# Patient Record
Sex: Female | Born: 1970 | Race: Black or African American | Hispanic: No | Marital: Married | Smoking: Former smoker
Health system: Southern US, Community
[De-identification: ages and names within clinical notes are randomized; demographics above are authoritative.]

## PROBLEM LIST (undated history)

## (undated) DIAGNOSIS — A749 Chlamydial infection, unspecified: Secondary | ICD-10-CM

## (undated) DIAGNOSIS — G4730 Sleep apnea, unspecified: Secondary | ICD-10-CM

## (undated) DIAGNOSIS — K219 Gastro-esophageal reflux disease without esophagitis: Secondary | ICD-10-CM

## (undated) DIAGNOSIS — Z803 Family history of malignant neoplasm of breast: Secondary | ICD-10-CM

## (undated) DIAGNOSIS — I10 Essential (primary) hypertension: Secondary | ICD-10-CM

## (undated) DIAGNOSIS — R7303 Prediabetes: Secondary | ICD-10-CM

## (undated) DIAGNOSIS — B379 Candidiasis, unspecified: Secondary | ICD-10-CM

## (undated) DIAGNOSIS — R0602 Shortness of breath: Secondary | ICD-10-CM

## (undated) HISTORY — DX: Shortness of breath: R06.02

## (undated) HISTORY — DX: Essential (primary) hypertension: I10

## (undated) HISTORY — PX: HEMORRHOID SURGERY: SHX153

## (undated) HISTORY — DX: Sleep apnea, unspecified: G47.30

## (undated) HISTORY — PX: BUNIONECTOMY: SHX129

## (undated) HISTORY — DX: Gastro-esophageal reflux disease without esophagitis: K21.9

## (undated) HISTORY — DX: Family history of malignant neoplasm of breast: Z80.3

## (undated) HISTORY — PX: ABDOMINAL HYSTERECTOMY: SHX81

## (undated) HISTORY — PX: WISDOM TOOTH EXTRACTION: SHX21

## (undated) HISTORY — DX: Prediabetes: R73.03

## (undated) HISTORY — DX: Chlamydial infection, unspecified: A74.9

## (undated) HISTORY — DX: Candidiasis, unspecified: B37.9

---

## 2006-06-05 ENCOUNTER — Emergency Department (HOSPITAL_COMMUNITY): Admission: EM | Admit: 2006-06-05 | Discharge: 2006-06-05 | Payer: Self-pay | Admitting: Family Medicine

## 2009-05-18 ENCOUNTER — Emergency Department (HOSPITAL_COMMUNITY): Admission: EM | Admit: 2009-05-18 | Discharge: 2009-05-18 | Payer: Self-pay | Admitting: Family Medicine

## 2012-04-21 ENCOUNTER — Encounter: Payer: Self-pay | Admitting: Obstetrics and Gynecology

## 2012-04-21 ENCOUNTER — Ambulatory Visit (INDEPENDENT_AMBULATORY_CARE_PROVIDER_SITE_OTHER): Payer: 59 | Admitting: Obstetrics and Gynecology

## 2012-04-21 VITALS — BP 134/90 | HR 72 | Ht 64.0 in | Wt 207.0 lb

## 2012-04-21 DIAGNOSIS — N898 Other specified noninflammatory disorders of vagina: Secondary | ICD-10-CM

## 2012-04-21 DIAGNOSIS — Z1231 Encounter for screening mammogram for malignant neoplasm of breast: Secondary | ICD-10-CM

## 2012-04-21 DIAGNOSIS — Z113 Encounter for screening for infections with a predominantly sexual mode of transmission: Secondary | ICD-10-CM

## 2012-04-21 DIAGNOSIS — Z124 Encounter for screening for malignant neoplasm of cervix: Secondary | ICD-10-CM

## 2012-04-21 DIAGNOSIS — A499 Bacterial infection, unspecified: Secondary | ICD-10-CM

## 2012-04-21 DIAGNOSIS — N920 Excessive and frequent menstruation with regular cycle: Secondary | ICD-10-CM

## 2012-04-21 DIAGNOSIS — N760 Acute vaginitis: Secondary | ICD-10-CM

## 2012-04-21 LAB — POCT WET PREP (WET MOUNT)
Clue Cells Wet Prep Whiff POC: POSITIVE
pH: 5

## 2012-04-21 LAB — CBC
Hemoglobin: 12.8 g/dL (ref 12.0–15.0)
MCH: 28.1 pg (ref 26.0–34.0)
MCV: 85.1 fL (ref 78.0–100.0)
RBC: 4.56 MIL/uL (ref 3.87–5.11)

## 2012-04-21 NOTE — Progress Notes (Signed)
Last Pap: Pt states she has not had 1 in years WNL: Yes Regular Periods:yes Contraception: condoms  Monthly Breast exam:yes Tetanus<27yrs:yes Nl.Bladder Function:yes Daily BMs:yes Healthy Diet:no Calcium:no Mammogram:no Date of Mammogram: n/a Exercise:no Have often Exercise: n/a Seatbelt: yes Abuse at home: no Stressful work:no Sigmoid-colonoscopy: n/a Bone Density: No PCP: Dr. Nicholos Johns Change in PMH: n/a Change in FMH:n/a BP 134/90  Pulse 72  Ht 5\' 4"  (1.626 m)  Wt 207 lb (93.895 kg)  BMI 35.53 kg/m2  LMP 04/11/2012 Pt with complaints:yes she has a smelly vaginaldischarge.  She has some itching  as well.  She also has heavy periods.  She uses a pack of pads with each period and has bleeding through her clothes.  She passes large clots and for two days of her menses she changes a pad every hr.  They last for 3-4 days Physical Examination: General appearance - alert, well appearing, and in no distress Mental status - normal mood, behavior, speech, dress, motor activity, and thought processes Neck - supple, no significant adenopathy,  thyroid exam: thyroid is normal in size without nodules or tenderness Chest - clear to auscultation, no wheezes, rales or rhonchi, symmetric air entry Heart - normal rate and regular rhythm Abdomen - soft, nontender, nondistended, no masses or organomegaly Breasts - breasts appear normal, no suspicious masses, no skin or nipple changes or axillary nodes Pelvic - normal external genitalia, vulva, vagina, cervix, uterus and adnexa Rectal - rectal exam not indicated Back exam - full range of motion, no tenderness, palpable spasm or pain on motion Neurological - alert, oriented, normal speech, no focal findings or movement disorder noted Musculoskeletal - no joint tenderness, deformity or swelling Extremities - no edema, redness or tenderness in the calves or thighs Skin - normal coloration and turgor, no rashes, no suspicious skin lesions  noted Routine exam BV menorrhagia Pap sent yes Mammogram due yes and scheduled condoms used for contraception RT for shg/embx.  Check TSH and PRL with CBC metrogel for BV

## 2012-04-21 NOTE — Patient Instructions (Signed)

## 2012-04-22 ENCOUNTER — Telehealth: Payer: Self-pay | Admitting: Obstetrics and Gynecology

## 2012-04-22 ENCOUNTER — Other Ambulatory Visit: Payer: Self-pay | Admitting: Obstetrics and Gynecology

## 2012-04-22 MED ORDER — METRONIDAZOLE 0.75 % VA GEL: 1.0000 | Freq: Every day | VAGINAL | Status: DC

## 2012-04-22 NOTE — Telephone Encounter (Signed)
TC to pt.  Informed Rx has been sent to pharmacy. Pt verbalizes comprehension.

## 2012-04-22 NOTE — Telephone Encounter (Signed)
VM from pt. Saw DR ND 04/21/12.  Was awaiting Rx for gel to treat BV to be called to [facility].

## 2012-04-25 LAB — PAP IG, CT-NG, RFX HPV ASCU

## 2012-04-28 ENCOUNTER — Encounter: Payer: Self-pay | Admitting: Obstetrics and Gynecology

## 2012-05-14 ENCOUNTER — Ambulatory Visit: Payer: Self-pay

## 2012-06-26 ENCOUNTER — Encounter: Payer: 59 | Admitting: Obstetrics and Gynecology

## 2012-06-27 ENCOUNTER — Encounter: Payer: 59 | Admitting: Obstetrics and Gynecology

## 2014-03-01 ENCOUNTER — Encounter: Payer: Self-pay | Admitting: Obstetrics and Gynecology

## 2014-07-14 ENCOUNTER — Encounter (HOSPITAL_COMMUNITY): Payer: Self-pay | Admitting: Emergency Medicine

## 2014-07-14 ENCOUNTER — Emergency Department (INDEPENDENT_AMBULATORY_CARE_PROVIDER_SITE_OTHER): Payer: 59

## 2014-07-14 ENCOUNTER — Emergency Department (HOSPITAL_COMMUNITY)
Admission: EM | Admit: 2014-07-14 | Discharge: 2014-07-14 | Disposition: A | Discharge status: Home / Self Care | Payer: 59 | Source: Home / Self Care | Attending: Family Medicine | Admitting: Family Medicine

## 2014-07-14 DIAGNOSIS — M25562 Pain in left knee: Secondary | ICD-10-CM

## 2014-07-14 DIAGNOSIS — M25572 Pain in left ankle and joints of left foot: Secondary | ICD-10-CM | POA: Diagnosis not present

## 2014-07-14 LAB — CBC
HEMATOCRIT: 36.9 % (ref 36.0–46.0)
HEMOGLOBIN: 11.6 g/dL — AB (ref 12.0–15.0)
MCH: 24.7 pg — ABNORMAL LOW (ref 26.0–34.0)
MCHC: 31.4 g/dL (ref 30.0–36.0)
MCV: 78.5 fL (ref 78.0–100.0)
Platelets: 358 10*3/uL (ref 150–400)
RBC: 4.7 MIL/uL (ref 3.87–5.11)
RDW: 18 % — AB (ref 11.5–15.5)
WBC: 7.5 10*3/uL (ref 4.0–10.5)

## 2014-07-14 LAB — POCT I-STAT, CHEM 8
BUN: 10 mg/dL (ref 6–23)
Calcium, Ion: 1.1 mmol/L — ABNORMAL LOW (ref 1.12–1.23)
Chloride: 102 mmol/L (ref 96–112)
Creatinine, Ser: 0.8 mg/dL (ref 0.50–1.10)
GLUCOSE: 96 mg/dL (ref 70–99)
HEMATOCRIT: 41 % (ref 36.0–46.0)
HEMOGLOBIN: 13.9 g/dL (ref 12.0–15.0)
POTASSIUM: 4.4 mmol/L (ref 3.5–5.1)
Sodium: 137 mmol/L (ref 135–145)
TCO2: 23 mmol/L (ref 0–100)

## 2014-07-14 LAB — D-DIMER, QUANTITATIVE: D-Dimer, Quant: 0.39 ug/mL-FEU (ref 0.00–0.48)

## 2014-07-14 MED ORDER — DICLOFENAC SODIUM 75 MG PO TBEC: 75.0000 mg | DELAYED_RELEASE_TABLET | Freq: Two times a day (BID) | ORAL | Status: DC | PRN

## 2014-07-14 MED ORDER — CLINDAMYCIN HCL 300 MG PO CAPS: 300.0000 mg | ORAL_CAPSULE | Freq: Three times a day (TID) | ORAL | Status: DC
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
## 2014-07-14 NOTE — Discharge Instructions (Signed)
Thank you for coming in today. Use the cam walker as needed. I will call with results of lab tests today We will call and other medicines as needed Call or go to the emergency room if you get worse, have trouble breathing, have chest pains, or palpitations.   Posterior Tibial Tendon Tendinitis with Rehab Tendonitis is a condition that is characterized by inflammation of a tendon or the lining (sheath) that surrounds it. The inflammation is usually caused by damage to the tendon, such as a tendon tear (strain). Sprains are classified into three categories. Grade 1 sprains cause pain, but the tendon is not lengthened. Grade 2 sprains include a lengthened ligament due to the ligament being stretched or partially ruptured. With grade 2 sprains there is still function, although the function may be diminished. Grade 3 sprains are characterized by a complete tear of the tendon or muscle, and function is usually impaired. Posterior tibialis tendonitis is tendonitis of the posterior tibial tendon, which attaches muscles of the lower leg to the foot. The posterior tibial tendon is located in the back of the ankle and helps the body straighten (plantar flex) and rotate inward (medially rotate) the ankle. SYMPTOMS   Pain, tenderness, swelling, warmth, and/or redness over the back of the inner ankle at the posterior tibial tendon or the inner part of the mid-foot.  Pain that worsens with plantar flexion or medial rotation of the ankle.  A crackling sound (crepitation) when the tendon is moved or touched. CAUSES  Posterior tibial tendonitis occurs when damage to the posterior tibial tendon starts an inflammatory response. Common mechanisms of injury include:  Degenerative (occurs with aging) processes that weaken the tendon and make it more susceptible to injury.  Stress placed on the tendon from an increase in the intensity, frequency, or duration of training.  Direct trauma to the ankle.  Returning to  activity before a previous ankle injury is allowed to heal. RISK INCREASES WITH:  Activities that involve repetitive and/or stressful plantar flexion (jumping, kicking, or running up/down hills).  Poor strength and flexibility.  Flat feet.  Previous injury to the foot, ankle, or leg. PREVENTION   Warm up and stretch properly before activity.  Allow for adequate recovery between workouts.  Maintain physical fitness:  Strength, flexibility, and endurance.  Cardiovascular fitness.  Learn and use proper technique. When possible, have a coach correct improper technique.  Complete rehabilitation from a previous foot, ankle, or leg injury.  If you have flat feet, wear arch supports (orthotics). PROGNOSIS  If treated properly, the symptoms of tendonitis usually resolve within 6 weeks. This period may be shorter for injuries caused by direct trauma. RELATED COMPLICATIONS   Prolonged healing time, if improperly treated or reinjured.  Recurrent symptoms that result in a chronic problem.  Partial or complete tendon tear (rupture) requiring surgery. TREATMENT  Treatment initially involves the use of ice and medication to help reduce pain and inflammation. The use of strengthening and stretching exercises may help reduce pain with activity. These exercises may be performed at home or with referral to a therapist. Often times, your caregiver will recommend immobilizing the ankle to allow the tendon to heal. If you have flat feet, you may be advised to wear orthotic arch supports. If symptoms persist for greater than 6 months despite nonsurgical (conservative) treatment, then surgery may be recommended. MEDICATION   If pain medication is necessary, then nonsteroidal anti-inflammatory medications, such as aspirin and ibuprofen, or other minor pain relievers, such as acetaminophen,  are often recommended.  Do not take pain medication for 7 days before surgery.  Prescription pain relievers  may be given if deemed necessary by your caregiver. Use only as directed and only as much as you need.  Corticosteroid injections may be given by your caregiver. These injections should be reserved for the most serious cases because they may only be given a certain number of times. HEAT AND COLD  Cold treatment (icing) relieves pain and reduces inflammation. Cold treatment should be applied for 10 to 15 minutes every 2 to 3 hours for inflammation and pain and immediately after any activity that aggravates your symptoms. Use ice packs or massage the area with a piece of ice (ice massage).  Heat treatment may be used prior to performing the stretching and strengthening activities prescribed by your caregiver, physical therapist, or athletic trainer. Use a heat pack or soak the injury in warm water. SEEK MEDICAL CARE IF:  Treatment seems to offer no benefit, or the condition worsens.  Any medications produce adverse side effects. EXERCISES RANGE OF MOTION (ROM) AND STRETCHING EXERCISES - Posterior Tibial Tendon Tendinitis These exercises may help you when beginning to rehabilitate your injury. Your symptoms may resolve with or without further involvement from your physician, physical therapist or athletic trainer. While completing these exercises, remember:   Restoring tissue flexibility helps normal motion to return to the joints. This allows healthier, less painful movement and activity.  An effective stretch should be held for at least 30 seconds.  A stretch should never be painful. You should only feel a gentle lengthening or release in the stretched tissue. RANGE OF MOTION - Ankle Plantar Flexion   Sit with your right / left leg crossed over your opposite knee.  Use your opposite hand to pull the top of your foot and toes toward you.  You should feel a gentle stretch on the top of your foot/ankle. Hold this position for __________ seconds. Repeat __________ times. Complete this  exercise __________ times per day.  RANGE OF MOTION - Ankle Eversion   Sit with your right / left ankle crossed over your opposite knee.  Grip your foot with your opposite hand, placing your thumb on the top of your foot and your fingers across the bottom of your foot.  Gently push your foot downward with a slight rotation so your littlest toes rise slightly.  You should feel a gentle stretch on the inside of your ankle. Hold the stretch for __________ seconds. Repeat __________ times. Complete this exercise __________ times per day.  RANGE OF MOTION - Ankle Inversion   Sit with your right / left ankle crossed over your opposite knee.  Grip your foot with your opposite hand, placing your thumb on the bottom of your foot and your fingers across the top of your foot.  Gently pull your foot so the smallest toe comes toward you and your thumb pushes the inside of the ball of your foot away from you.  You should feel a gentle stretch on the outside of your ankle. Hold the stretch for __________ seconds. Repeat __________ times. Complete this exercise __________ times per day.  RANGE OF MOTION - Dorsi/Plantar Flexion  While sitting with your right / left knee straight, draw the top of your foot upward by flexing your ankle. Then reverse the motion, pointing your toes downward.  Hold each position for __________ seconds.  After completing your first set of exercises, repeat this exercise with your knee bent. Repeat __________  times. Complete this exercise __________ times per day.  RANGE OF MOTION - Ankle Alphabet  Imagine your right / left big toe is a pen.  Keeping your hip and knee still, write out the entire alphabet with your "pen." Make the letters as large as you can without increasing any discomfort. Repeat __________ times. Complete this exercise __________ times per day.  STRETCH - Gastrocsoleus   Sit with your right / left leg extended. Holding onto both ends of a belt or  towel, loop it around the ball of your foot.  Keeping your right / left ankle and foot relaxed and your knee straight, pull your foot and ankle toward you using the belt/towel.  You should feel a gentle stretch behind your calf or knee. Hold this position for __________ seconds. Repeat __________ times. Complete this exercise __________ times per day.  STRETCH - Gastroc, Standing   Place hands on wall.  Extend right / left leg, keeping the front knee somewhat bent.  Slightly point your toes inward on your back foot.  Keeping your right / left heel on the floor and your knee straight, shift your weight toward the wall, not allowing your back to arch.  You should feel a gentle stretch in the right / left calf. Hold this position for __________ seconds. Repeat __________ times. Complete this stretch __________ times per day. STRETCH - Soleus, Standing   Place hands on wall.  Extend right / left leg, keeping the other knee somewhat bent.  Slightly point your toes inward on your back foot.  Keep your right / left heel on the floor, bend your back knee, and slightly shift your weight over the back leg so that you feel a gentle stretch deep in your back calf.  Hold this position for __________ seconds. Repeat __________ times. Complete this stretch __________ times per day. STRENGTHENING EXERCISES - Posterior Tibial Tendon Tendinitis These exercises may help you when beginning to rehabilitate your injury. They may resolve your symptoms with or without further involvement from your physician, physical therapist, or athletic trainer. While completing these exercises, remember:   Muscles can gain both the endurance and the strength needed for everyday activities through controlled exercises.  Complete these exercises as instructed by your physician, physical therapist, or athletic trainer. Progress the resistance and repetitions only as guided. STRENGTH - Dorsiflexors  Secure a rubber  exercise band/tubing to a fixed object (i.e., table, pole) and loop the other end around your right / left foot.  Sit on the floor facing the fixed object. The band/tubing should be slightly tense when your foot is relaxed.  Slowly draw your foot back toward you using your ankle and toes.  Hold this position for __________ seconds. Slowly release the tension in the band and return your foot to the starting position. Repeat __________ times. Complete this exercise __________ times per day.  STRENGTH - Towel Curls  Sit in a chair positioned on a non-carpeted surface.  Place your foot on a towel, keeping your heel on the floor.  Pull the towel toward your heel by only curling your toes. Keep your heel on the floor.  If instructed by your physician, physical therapist, or athletic trainer, add ____________________ at the end of the towel. Repeat __________ times. Complete this exercise __________ times per day. STRENGTH - Ankle Eversion   Secure one end of a rubber exercise band/tubing to a fixed object (table, pole). Loop the other end around your foot just before your toes.  Place your fists between your knees. This will focus your strengthening at your ankle.  Drawing the band/tubing across your opposite foot, slowly pull your little toe out and up. Make sure the band/tubing is positioned to resist the entire motion.  Hold this position for __________ seconds.  Have your muscles resist the band/tubing as it slowly pulls your foot back to the starting position. Repeat __________ times. Complete this exercise __________ times per day.  STRENGTH - Ankle Inversion   Secure one end of a rubber exercise band/tubing to a fixed object (table, pole). Loop the other end around your foot just before your toes.  Place your fists between your knees. This will focus your strengthening at your ankle.  Slowly, pull your big toe up and in, making sure the band/tubing is positioned to resist the  entire motion.  Hold this position for __________ seconds.  Have your muscles resist the band/tubing as it slowly pulls your foot back to the starting position. Repeat __________ times. Complete this exercises __________ times per day.  Document Released: 04/16/2005 Document Revised: 08/31/2013 Document Reviewed: 07/29/2008 [facility] Patient Information 2015 [place], [place]. This information is not intended to replace advice given to you by your health care provider. Make sure you discuss any questions you have with your health care provider.   Cellulitis Cellulitis is an infection of the skin and the tissue beneath it. The infected area is usually red and tender. Cellulitis occurs most often in the arms and lower legs.  CAUSES  Cellulitis is caused by bacteria that enter the skin through cracks or cuts in the skin. The most common types of bacteria that cause cellulitis are staphylococci and streptococci. SIGNS AND SYMPTOMS   Redness and warmth.  Swelling.  Tenderness or pain.  Fever. DIAGNOSIS  Your health care provider can usually determine what is wrong based on a physical exam. Blood tests may also be done. TREATMENT  Treatment usually involves taking an antibiotic medicine. HOME CARE INSTRUCTIONS   Take your antibiotic medicine as directed by your health care provider. Finish the antibiotic even if you start to feel better.  Keep the infected arm or leg elevated to reduce swelling.  Apply a warm cloth to the affected area up to 4 times per day to relieve pain.  Take medicines only as directed by your health care provider.  Keep all follow-up visits as directed by your health care provider. SEEK MEDICAL CARE IF:   You notice red streaks coming from the infected area.  Your red area gets larger or turns dark in color.  Your bone or joint underneath the infected area becomes painful after the skin has healed.  Your infection returns in the same area or another  area.  You notice a swollen bump in the infected area.  You develop new symptoms.  You have a fever. SEEK IMMEDIATE MEDICAL CARE IF:   You feel very sleepy.  You develop vomiting or diarrhea.  You have a general ill feeling (malaise) with muscle aches and pains. MAKE SURE YOU:   Understand these instructions.  Will watch your condition.  Will get help right away if you are not doing well or get worse. Document Released: 01/24/2005 Document Revised: 08/31/2013 Document Reviewed: 07/02/2011 [facility] Patient Information 2015 [place], [place]. This information is not intended to replace advice given to you by your health care provider. Make sure you discuss any questions you have with your health care provider.

## 2014-09-01 ENCOUNTER — Other Ambulatory Visit: Payer: Self-pay | Admitting: Obstetrics and Gynecology

## 2014-09-05 NOTE — Patient Instructions (Addendum)
   Your procedure is scheduled on:  Tuesday, May 17  Enter through the Main Entrance of [facility] at:  7:30 AM Pick up the phone at the desk and dial [phone] and inform us of your arrival.  Please call this number if you have any problems the morning of surgery: [phone]  Remember: Do not eat or drink after midnight: Monday Take these medicines the morning of surgery with a SIP OF WATER: valtrex  Do not wear jewelry, make-up, or FINGER nail polish No metal in your hair or on your body. Do not wear lotions, powders, perfumes.  You may wear deodorant.  Do not bring valuables to the hospital.  Leave suitcase in the car. After Surgery it may be brought to your room. For patients being admitted to the hospital, checkout time is 11:00am the day of discharge.  Home with daughter Shelle Iron cell [phone]

## 2014-09-06 ENCOUNTER — Encounter (HOSPITAL_COMMUNITY)
Admission: RE | Admit: 2014-09-06 | Discharge: 2014-09-06 | Disposition: A | Discharge status: Home / Self Care | Payer: 59 | Source: Ambulatory Visit | Attending: Obstetrics and Gynecology | Admitting: Obstetrics and Gynecology

## 2014-09-06 ENCOUNTER — Encounter (HOSPITAL_COMMUNITY): Payer: Self-pay

## 2014-09-06 ENCOUNTER — Other Ambulatory Visit (HOSPITAL_COMMUNITY): Payer: Self-pay | Admitting: Obstetrics and Gynecology

## 2014-09-06 DIAGNOSIS — Z01818 Encounter for other preprocedural examination: Secondary | ICD-10-CM | POA: Insufficient documentation

## 2014-09-06 LAB — CBC
HEMATOCRIT: 33.1 % — AB (ref 36.0–46.0)
HEMOGLOBIN: 10.2 g/dL — AB (ref 12.0–15.0)
MCH: 23.4 pg — ABNORMAL LOW (ref 26.0–34.0)
MCHC: 30.8 g/dL (ref 30.0–36.0)
MCV: 76.1 fL — ABNORMAL LOW (ref 78.0–100.0)
Platelets: 422 10*3/uL — ABNORMAL HIGH (ref 150–400)
RBC: 4.35 MIL/uL (ref 3.87–5.11)
RDW: 16.9 % — AB (ref 11.5–15.5)
WBC: 7.1 10*3/uL (ref 4.0–10.5)

## 2014-09-06 NOTE — H&P (Signed)
Aimee Mejia is a 44 y.o. female P 0-2-1-2-1 presents for hysterectomy because of irregular bleeding.  Patient gives a history of menorrhagia during her 3-7 day menstrual flow that over the past 6 months has begun to happen twice a month.  Each bleeding episode requires the patient to wear a Depends  to prevent soiling of clothes and linen.  She admits to clots and cramping rated at 8/10 on a 10 point pain scale but has found relief with Naproxen.  She denies urinary tract symptoms or vaginitis symptoms but admits to dyspareunia and constipation.  A sono-hysterogram April 2016 showed: uterus: (retroflexed) 7.53 x 8.07 x 6.2 cm, endometrium: 0.638 cm, a posterior intramural fibroid 1.8 x 1.4 x 1.6 cm and no focal lesions seen within the endometrial cavity. Both ovaries appeared normal.  A TSH, Prolactin during this evaluation was normal,  Hemoglobin/Hematocrit = 11.6/36.6  and endometrial biopsy returned benign findings.   A review of both medical and surgical management options were given to the patient however, she has decided on definitive therapy in the form of hysterectomy.   Past Medical History  OB History: G: 4;   P 0-2-2-1;   P: C-section 1998  GYN History: menarche: 44 YO    LMP: 08/24/2014    Contracepton abstinence  The patient reports a past history of: chlamydia and herpes.  Denies history of abnormal PAP smear;   Last PAP smear: 06/22/2014-normal  Medical History: NONE  Surgical History: NONE except C-section Denies problems with anesthesia or history of blood transfusions  Family History: Hypertension, Breast Cancer and Diabetes Mellitus  Social History: Married and employed as a Surveyor, mining;  Former smoker and occasionally uses alcohol   Medications:  Valacyclovir 500 mg bid x 3 days prn Naproxen 550 mg   bid prn  Allergies  Allergen Reactions  . Penicillins Hives  . Percocet [Oxycodone-Acetaminophen] Hives   Able to take Vicodin  Denies sensitivity to  peanuts, shellfish, soy, latex or adhesives.  ROS: Admits to glasses and constipation but  denies headache, vision changes, nasal congestion, dysphagia, tinnitus, dizziness, hoarseness, cough,  chest pain, shortness of breath, nausea, vomiting, diarrhea,  urinary frequency, urgency  dysuria, hematuria, vaginitis symptoms, pelvic pain, swelling of joints,easy bruising,  myalgias, arthralgias, skin rashes, unexplained weight loss and except as is mentioned in the history of present illness, patient's review of systems is otherwise negative.     Physical Exam  Bp:  120/74   P: 80   R: 20   Temperature: 98.4 degrees F orally   Weight: 209 lbs.  Height: 5'4"   BMI: 35.9  Neck: supple without masses or thyromegaly Lungs: clear to auscultation Heart: regular rate and rhythm Abdomen: soft, non-tender and no organomegaly Pelvic:EGBUS- wnl; vagina-normal rugae; uterus-normal size, cervix without lesions or motion tenderness; adnexae-no tenderness or masses Extremities:  no clubbing, cyanosis or edema   Assesment:  Irregular Bleeding            Dysmenorrhea   Disposition:  A discussion was held with patient regarding the indication for her procedure(s) along with the risks, which include but are not limited to: reaction to anesthesia, damage to adjacent organs, infection and excessive bleeding.  The patient verbalized understanding of these risks and has consented to proceed with a Laparoscopically Assisted Total Vaginal Hysterectomy with Bilateral Salpingectomy at [facility] on Sep 14, 2014 at 9 a.m.   CSN# [id number]   Aimee Eugenio J. Florene Glen, PA-C  for Dr.  Franklyn Lor. Dillard

## 2014-09-14 ENCOUNTER — Ambulatory Visit (HOSPITAL_COMMUNITY): Payer: 59 | Admitting: Anesthesiology

## 2014-09-14 ENCOUNTER — Encounter (HOSPITAL_COMMUNITY): Payer: Self-pay | Admitting: Anesthesiology

## 2014-09-14 ENCOUNTER — Encounter (HOSPITAL_COMMUNITY)
Admission: RE | Disposition: A | Discharge status: Home / Self Care | Payer: Self-pay | Source: Ambulatory Visit | Attending: Obstetrics and Gynecology

## 2014-09-14 ENCOUNTER — Observation Stay (HOSPITAL_COMMUNITY)
Admission: RE | Admit: 2014-09-14 | Discharge: 2014-09-15 | Disposition: A | Discharge status: Home / Self Care | Payer: 59 | Source: Ambulatory Visit | Attending: Obstetrics and Gynecology | Admitting: Obstetrics and Gynecology

## 2014-09-14 DIAGNOSIS — D259 Leiomyoma of uterus, unspecified: Principal | ICD-10-CM | POA: Insufficient documentation

## 2014-09-14 DIAGNOSIS — N840 Polyp of corpus uteri: Secondary | ICD-10-CM | POA: Diagnosis not present

## 2014-09-14 DIAGNOSIS — Z87891 Personal history of nicotine dependence: Secondary | ICD-10-CM | POA: Diagnosis not present

## 2014-09-14 DIAGNOSIS — N920 Excessive and frequent menstruation with regular cycle: Secondary | ICD-10-CM | POA: Diagnosis present

## 2014-09-14 DIAGNOSIS — N946 Dysmenorrhea, unspecified: Secondary | ICD-10-CM | POA: Insufficient documentation

## 2014-09-14 DIAGNOSIS — Z6836 Body mass index (BMI) 36.0-36.9, adult: Secondary | ICD-10-CM | POA: Insufficient documentation

## 2014-09-14 HISTORY — PX: LAPAROSCOPIC ASSISTED VAGINAL HYSTERECTOMY: SHX5398

## 2014-09-14 HISTORY — PX: CYSTOSCOPY: SHX5120

## 2014-09-14 LAB — BASIC METABOLIC PANEL
ANION GAP: 8 (ref 5–15)
BUN: 9 mg/dL (ref 6–20)
CHLORIDE: 105 mmol/L (ref 101–111)
CO2: 25 mmol/L (ref 22–32)
Calcium: 8.4 mg/dL — ABNORMAL LOW (ref 8.9–10.3)
Creatinine, Ser: 0.77 mg/dL (ref 0.44–1.00)
GFR calc Af Amer: >60 mL/min (ref >60)
GFR calc non Af Amer: >60 mL/min (ref >60)
Glucose, Bld: 110 mg/dL — ABNORMAL HIGH (ref 65–99)
Potassium: 4.2 mmol/L (ref 3.5–5.1)
Sodium: 138 mmol/L (ref 135–145)

## 2014-09-14 LAB — PREGNANCY, URINE: Preg Test, Ur: NEGATIVE

## 2014-09-14 SURGERY — HYSTERECTOMY, VAGINAL, LAPAROSCOPY-ASSISTED
Anesthesia: General | Site: Bladder

## 2014-09-14 MED ORDER — ESTRADIOL 0.1 MG/GM VA CREA
TOPICAL_CREAM | VAGINAL | Status: DC | PRN
  Administered 2014-09-14: 1 via VAGINAL

## 2014-09-14 MED ORDER — METHYLENE BLUE 1 % INJ SOLN
INTRAMUSCULAR | Status: DC | PRN
  Administered 2014-09-14: 8 mL via INTRAVENOUS

## 2014-09-14 MED ORDER — DEXAMETHASONE SODIUM PHOSPHATE 10 MG/ML IJ SOLN
INTRAMUSCULAR | Status: AC
  Filled 2014-09-14: qty 1

## 2014-09-14 MED ORDER — ROCURONIUM BROMIDE 100 MG/10ML IV SOLN
INTRAVENOUS | Status: AC
  Filled 2014-09-14: qty 1

## 2014-09-14 MED ORDER — MIDAZOLAM HCL 2 MG/2ML IJ SOLN
INTRAMUSCULAR | Status: DC | PRN
  Administered 2014-09-14: 1 mg via INTRAVENOUS

## 2014-09-14 MED ORDER — LACTATED RINGERS IV SOLN
INTRAVENOUS | Status: DC
  Administered 2014-09-14: 22:00:00 via INTRAVENOUS

## 2014-09-14 MED ORDER — VASOPRESSIN 20 UNIT/ML IV SOLN
INTRAVENOUS | Status: DC | PRN
  Administered 2014-09-14: 20 mL via INTRAMUSCULAR

## 2014-09-14 MED ORDER — ONDANSETRON HCL 4 MG/2ML IJ SOLN
INTRAMUSCULAR | Status: AC
  Filled 2014-09-14: qty 2

## 2014-09-14 MED ORDER — BUPIVACAINE HCL (PF) 0.25 % IJ SOLN
INTRAMUSCULAR | Status: AC
  Filled 2014-09-14: qty 30

## 2014-09-14 MED ORDER — ACETAMINOPHEN 160 MG/5ML PO SOLN
975.0000 mg | Freq: Once | ORAL | Status: AC
  Administered 2014-09-14: 975 mg via ORAL

## 2014-09-14 MED ORDER — SCOPOLAMINE 1 MG/3DAYS TD PT72
MEDICATED_PATCH | TRANSDERMAL | Status: AC
  Filled 2014-09-14: qty 1

## 2014-09-14 MED ORDER — LACTATED RINGERS IR SOLN
Status: DC | PRN
  Administered 2014-09-14: 3000 mL

## 2014-09-14 MED ORDER — FENTANYL CITRATE (PF) 250 MCG/5ML IJ SOLN
INTRAMUSCULAR | Status: AC
  Filled 2014-09-14: qty 5

## 2014-09-14 MED ORDER — MIDAZOLAM HCL 2 MG/2ML IJ SOLN
INTRAMUSCULAR | Status: AC
  Filled 2014-09-14: qty 2

## 2014-09-14 MED ORDER — ROCURONIUM BROMIDE 100 MG/10ML IV SOLN
INTRAVENOUS | Status: DC | PRN
  Administered 2014-09-14 (×4): 10 mg via INTRAVENOUS
  Administered 2014-09-14: 50 mg via INTRAVENOUS
  Administered 2014-09-14: 5 mg via INTRAVENOUS

## 2014-09-14 MED ORDER — LIDOCAINE HCL (CARDIAC) 20 MG/ML IV SOLN
INTRAVENOUS | Status: DC | PRN
  Administered 2014-09-14: 80 mg via INTRAVENOUS

## 2014-09-14 MED ORDER — FENTANYL CITRATE (PF) 100 MCG/2ML IJ SOLN
INTRAMUSCULAR | Status: AC
  Filled 2014-09-14: qty 2

## 2014-09-14 MED ORDER — LACTATED RINGERS IV SOLN
INTRAVENOUS | Status: DC
  Administered 2014-09-14 (×3): via INTRAVENOUS

## 2014-09-14 MED ORDER — EPHEDRINE SULFATE 50 MG/ML IJ SOLN
INTRAMUSCULAR | Status: DC | PRN
  Administered 2014-09-14: 5 mg via INTRAVENOUS

## 2014-09-14 MED ORDER — ESTRADIOL 0.1 MG/GM VA CREA
TOPICAL_CREAM | VAGINAL | Status: AC
  Filled 2014-09-14: qty 42.5

## 2014-09-14 MED ORDER — BUPIVACAINE HCL (PF) 0.25 % IJ SOLN
INTRAMUSCULAR | Status: DC | PRN
  Administered 2014-09-14: 13 mL

## 2014-09-14 MED ORDER — DIPHENHYDRAMINE HCL 50 MG/ML IJ SOLN: 12.5000 mg | Freq: Four times a day (QID) | INTRAMUSCULAR | Status: DC | PRN

## 2014-09-14 MED ORDER — ACETAMINOPHEN 160 MG/5ML PO SOLN
ORAL | Status: AC
  Filled 2014-09-14: qty 20.3

## 2014-09-14 MED ORDER — PHENYLEPHRINE 40 MCG/ML (10ML) SYRINGE FOR IV PUSH (FOR BLOOD PRESSURE SUPPORT)
PREFILLED_SYRINGE | INTRAVENOUS | Status: AC
  Filled 2014-09-14: qty 10

## 2014-09-14 MED ORDER — DEXAMETHASONE SODIUM PHOSPHATE 10 MG/ML IJ SOLN
INTRAMUSCULAR | Status: DC | PRN
  Administered 2014-09-14: 10 mg via INTRAVENOUS

## 2014-09-14 MED ORDER — ONDANSETRON HCL 4 MG/2ML IJ SOLN: 4.0000 mg | Freq: Four times a day (QID) | INTRAMUSCULAR | Status: DC | PRN

## 2014-09-14 MED ORDER — FENTANYL CITRATE (PF) 100 MCG/2ML IJ SOLN
INTRAMUSCULAR | Status: DC | PRN
  Administered 2014-09-14: 50 ug via INTRAVENOUS
  Administered 2014-09-14 (×2): 25 ug via INTRAVENOUS
  Administered 2014-09-14 (×2): 50 ug via INTRAVENOUS
  Administered 2014-09-14: 100 ug via INTRAVENOUS

## 2014-09-14 MED ORDER — EPHEDRINE 5 MG/ML INJ
INTRAVENOUS | Status: AC
  Filled 2014-09-14: qty 10

## 2014-09-14 MED ORDER — VASOPRESSIN 20 UNIT/ML IV SOLN
INTRAVENOUS | Status: AC
  Filled 2014-09-14: qty 1

## 2014-09-14 MED ORDER — PROPOFOL 10 MG/ML IV BOLUS
INTRAVENOUS | Status: AC
  Filled 2014-09-14: qty 40

## 2014-09-14 MED ORDER — LIDOCAINE HCL (CARDIAC) 20 MG/ML IV SOLN
INTRAVENOUS | Status: AC
  Filled 2014-09-14: qty 5

## 2014-09-14 MED ORDER — DIPHENHYDRAMINE HCL 12.5 MG/5ML PO ELIX: 12.5000 mg | ORAL_SOLUTION | Freq: Four times a day (QID) | ORAL | Status: DC | PRN

## 2014-09-14 MED ORDER — METHYLENE BLUE 1 % INJ SOLN
INTRAMUSCULAR | Status: AC
  Filled 2014-09-14: qty 10

## 2014-09-14 MED ORDER — GLYCOPYRROLATE 0.2 MG/ML IJ SOLN
INTRAMUSCULAR | Status: AC
  Filled 2014-09-14: qty 1

## 2014-09-14 MED ORDER — NEOSTIGMINE METHYLSULFATE 10 MG/10ML IV SOLN
INTRAVENOUS | Status: AC
  Filled 2014-09-14: qty 1

## 2014-09-14 MED ORDER — MENTHOL 3 MG MT LOZG: 1.0000 | LOZENGE | OROMUCOSAL | Status: DC | PRN

## 2014-09-14 MED ORDER — KETOROLAC TROMETHAMINE 30 MG/ML IJ SOLN
INTRAMUSCULAR | Status: DC | PRN
  Administered 2014-09-14: 30 mg via INTRAVENOUS

## 2014-09-14 MED ORDER — ONDANSETRON HCL 4 MG PO TABS: 4.0000 mg | ORAL_TABLET | Freq: Three times a day (TID) | ORAL | Status: DC | PRN

## 2014-09-14 MED ORDER — PROPOFOL 10 MG/ML IV BOLUS
INTRAVENOUS | Status: DC | PRN
  Administered 2014-09-14: 200 mg via INTRAVENOUS
  Administered 2014-09-14: 50 mg via INTRAVENOUS

## 2014-09-14 MED ORDER — GLYCOPYRROLATE 0.2 MG/ML IJ SOLN
INTRAMUSCULAR | Status: AC
  Filled 2014-09-14: qty 3

## 2014-09-14 MED ORDER — NEOSTIGMINE METHYLSULFATE 10 MG/10ML IV SOLN
INTRAVENOUS | Status: DC | PRN
  Administered 2014-09-14: 3 mg via INTRAVENOUS

## 2014-09-14 MED ORDER — HEPARIN SODIUM (PORCINE) 5000 UNIT/ML IJ SOLN
INTRAMUSCULAR | Status: AC
  Filled 2014-09-14: qty 1

## 2014-09-14 MED ORDER — NALOXONE HCL 0.4 MG/ML IJ SOLN: 0.4000 mg | INTRAMUSCULAR | Status: DC | PRN

## 2014-09-14 MED ORDER — SCOPOLAMINE 1 MG/3DAYS TD PT72
1.0000 | MEDICATED_PATCH | Freq: Once | TRANSDERMAL | Status: DC
  Administered 2014-09-14: 1.5 mg via TRANSDERMAL

## 2014-09-14 MED ORDER — METHYLENE BLUE 1 % INJ SOLN
INTRAMUSCULAR | Status: AC
  Filled 2014-09-14: qty 1

## 2014-09-14 MED ORDER — HYDROMORPHONE 0.3 MG/ML IV SOLN
INTRAVENOUS | Status: DC
  Administered 2014-09-14: 0.799 mg via INTRAVENOUS
  Administered 2014-09-14: 15:00:00 via INTRAVENOUS
  Administered 2014-09-15: 0.2 mg via INTRAVENOUS
  Administered 2014-09-15: 0 mg via INTRAVENOUS
  Filled 2014-09-14: qty 25

## 2014-09-14 MED ORDER — KETOROLAC TROMETHAMINE 30 MG/ML IJ SOLN
INTRAMUSCULAR | Status: AC
Start: 2014-09-14 — End: 2014-09-14
  Filled 2014-09-14: qty 1

## 2014-09-14 MED ORDER — FENTANYL CITRATE (PF) 100 MCG/2ML IJ SOLN
25.0000 ug | INTRAMUSCULAR | Status: DC | PRN
  Administered 2014-09-14: 50 ug via INTRAVENOUS

## 2014-09-14 MED ORDER — CEFAZOLIN SODIUM-DEXTROSE 2-3 GM-% IV SOLR
INTRAVENOUS | Status: AC
  Filled 2014-09-14: qty 50

## 2014-09-14 MED ORDER — HYDROCODONE-ACETAMINOPHEN 5-325 MG PO TABS: 1.0000 | ORAL_TABLET | ORAL | Status: DC | PRN

## 2014-09-14 MED ORDER — SODIUM CHLORIDE 0.9 % IJ SOLN
INTRAMUSCULAR | Status: AC
  Filled 2014-09-14: qty 100

## 2014-09-14 MED ORDER — GLYCOPYRROLATE 0.2 MG/ML IJ SOLN
INTRAMUSCULAR | Status: DC | PRN
  Administered 2014-09-14: 0.2 mg via INTRAVENOUS
  Administered 2014-09-14: 0.6 mg via INTRAVENOUS

## 2014-09-14 MED ORDER — ONDANSETRON HCL 4 MG/2ML IJ SOLN
INTRAMUSCULAR | Status: DC | PRN
  Administered 2014-09-14: 4 mg via INTRAVENOUS

## 2014-09-14 MED ORDER — KETOROLAC TROMETHAMINE 30 MG/ML IJ SOLN: INTRAMUSCULAR | Status: DC | PRN

## 2014-09-14 MED ORDER — SODIUM CHLORIDE 0.9 % IJ SOLN: 9.0000 mL | INTRAMUSCULAR | Status: DC | PRN

## 2014-09-14 MED ORDER — PHENYLEPHRINE HCL 10 MG/ML IJ SOLN
INTRAMUSCULAR | Status: DC | PRN
  Administered 2014-09-14 (×2): 40 ug via INTRAVENOUS
  Administered 2014-09-14 (×2): 80 ug via INTRAVENOUS
  Administered 2014-09-14 (×3): 40 ug via INTRAVENOUS
  Administered 2014-09-14: 4 ug via INTRAVENOUS
  Administered 2014-09-14 (×2): 40 ug via INTRAVENOUS
  Administered 2014-09-14: 80 ug via INTRAVENOUS
  Administered 2014-09-14 (×2): 40 ug via INTRAVENOUS
  Administered 2014-09-14: 80 ug via INTRAVENOUS
  Administered 2014-09-14 (×2): 40 ug via INTRAVENOUS

## 2014-09-14 MED ORDER — GENTAMICIN SULFATE 40 MG/ML IJ SOLN
INTRAMUSCULAR | Status: AC
  Administered 2014-09-14: 114.75 mL via INTRAVENOUS
  Filled 2014-09-14: qty 8.75

## 2014-09-14 MED ORDER — IBUPROFEN 600 MG PO TABS: 600.0000 mg | ORAL_TABLET | Freq: Four times a day (QID) | ORAL | Status: DC | PRN

## 2014-09-14 MED ORDER — KETOROLAC TROMETHAMINE 30 MG/ML IJ SOLN
30.0000 mg | Freq: Four times a day (QID) | INTRAMUSCULAR | Status: AC
Start: 2014-09-14 — End: 2014-09-15
  Administered 2014-09-14 – 2014-09-15 (×3): 30 mg via INTRAVENOUS
  Filled 2014-09-14 (×3): qty 1

## 2014-09-14 MED ORDER — LIDOCAINE-EPINEPHRINE (PF) 1 %-1:200000 IJ SOLN
INTRAMUSCULAR | Status: AC
  Filled 2014-09-14: qty 10

## 2014-09-14 SURGICAL SUPPLY — 65 items
CABLE HIGH FREQUENCY MONO STRZ (ELECTRODE) IMPLANT
CATH ROBINSON RED A/P 16FR (CATHETERS) IMPLANT
CLOSURE WOUND 1/4 X3 (GAUZE/BANDAGES/DRESSINGS) ×1
CLOTH BEACON ORANGE TIMEOUT ST (SAFETY) ×4 IMPLANT
CONT PATH 16OZ SNAP LID 3702 (MISCELLANEOUS) ×4 IMPLANT
COVER BACK TABLE 60X90IN (DRAPES) ×4 IMPLANT
COVER MAYO STAND STRL (DRAPES) IMPLANT
DECANTER SPIKE VIAL GLASS SM (MISCELLANEOUS) ×6 IMPLANT
DRAPE SHEET LG 3/4 BI-LAMINATE (DRAPES) ×8 IMPLANT
DRSG COVADERM PLUS 2X2 (GAUZE/BANDAGES/DRESSINGS) ×8 IMPLANT
DRSG OPSITE POSTOP 3X4 (GAUZE/BANDAGES/DRESSINGS) ×2 IMPLANT
DURAPREP 26ML APPLICATOR (WOUND CARE) ×4 IMPLANT
ELECT REM PT RETURN 9FT ADLT (ELECTROSURGICAL) ×4
ELECTRODE REM PT RTRN 9FT ADLT (ELECTROSURGICAL) IMPLANT
EVACUATOR SMOKE 8.L (FILTER) ×8 IMPLANT
FORCEPS CUTTING 33CM 5MM (CUTTING FORCEPS) ×2 IMPLANT
GAUZE PACKING 2X5 YD STRL (GAUZE/BANDAGES/DRESSINGS) ×2 IMPLANT
GAUZE VASELINE 3X9 (GAUZE/BANDAGES/DRESSINGS) IMPLANT
GLOVE BIO SURGEON STRL SZ 6.5 (GLOVE) ×6 IMPLANT
GLOVE BIO SURGEONS STRL SZ 6.5 (GLOVE) ×2
GLOVE BIOGEL PI IND STRL 6.5 (GLOVE) ×2 IMPLANT
GLOVE BIOGEL PI IND STRL 7.0 (GLOVE) ×6 IMPLANT
GLOVE BIOGEL PI INDICATOR 6.5 (GLOVE) ×8
GLOVE BIOGEL PI INDICATOR 7.0 (GLOVE) ×10
LIQUID BAND (GAUZE/BANDAGES/DRESSINGS) ×2 IMPLANT
NEEDLE MAYO .5 CIRCLE (NEEDLE) ×4 IMPLANT
NS IRRIG 1000ML POUR BTL (IV SOLUTION) ×4 IMPLANT
OCCLUDER COLPOPNEUMO (BALLOONS) IMPLANT
PACK LAVH (CUSTOM PROCEDURE TRAY) ×4 IMPLANT
PACK ROBOTIC GOWN (GOWN DISPOSABLE) ×4 IMPLANT
PAD POSITIONER PINK NONSTERILE (MISCELLANEOUS) ×4 IMPLANT
SCISSORS LAP 5X35 DISP (ENDOMECHANICALS) IMPLANT
SET CYSTO W/LG BORE CLAMP LF (SET/KITS/TRAYS/PACK) ×4 IMPLANT
SET IRRIG TUBING LAPAROSCOPIC (IRRIGATION / IRRIGATOR) ×2 IMPLANT
SHEARS HARMONIC ACE PLUS 36CM (ENDOMECHANICALS) IMPLANT
SLEEVE XCEL OPT CAN 5 100 (ENDOMECHANICALS) ×4 IMPLANT
SOLUTION ELECTROLUBE (MISCELLANEOUS) IMPLANT
SPONGE SURGIFOAM ABS GEL 12-7 (HEMOSTASIS) ×4 IMPLANT
STRIP CLOSURE SKIN 1/4X3 (GAUZE/BANDAGES/DRESSINGS) ×1 IMPLANT
SUT CHROMIC 0 CT 1 (SUTURE) ×4 IMPLANT
SUT CHROMIC 0 UR 5 27 (SUTURE) IMPLANT
SUT MNCRL AB 3-0 PS2 27 (SUTURE) ×8 IMPLANT
SUT PDS AB 1 CT1 36 (SUTURE) IMPLANT
SUT VIC AB 0 CT1 18XCR BRD8 (SUTURE) ×6 IMPLANT
SUT VIC AB 0 CT1 27 (SUTURE) ×4
SUT VIC AB 0 CT1 27XBRD ANBCTR (SUTURE) ×2 IMPLANT
SUT VIC AB 0 CT1 36 (SUTURE) ×4 IMPLANT
SUT VIC AB 0 CT1 8-18 (SUTURE) ×12
SUT VICRYL 0 ENDOLOOP (SUTURE) IMPLANT
SUT VICRYL 0 TIES 12 18 (SUTURE) ×4 IMPLANT
SUT VICRYL 0 UR6 27IN ABS (SUTURE) IMPLANT
SYR 50ML LL SCALE MARK (SYRINGE) IMPLANT
SYR BULB IRRIGATION 50ML (SYRINGE) ×4 IMPLANT
SYR TB 1ML LUER SLIP (SYRINGE) ×4 IMPLANT
TIP UTERINE 5.1X6CM LAV DISP (MISCELLANEOUS) IMPLANT
TIP UTERINE 6.7X10CM GRN DISP (MISCELLANEOUS) IMPLANT
TIP UTERINE 6.7X6CM WHT DISP (MISCELLANEOUS) IMPLANT
TIP UTERINE 6.7X8CM BLUE DISP (MISCELLANEOUS) IMPLANT
TOWEL OR 17X24 6PK STRL BLUE (TOWEL DISPOSABLE) ×8 IMPLANT
TRAY FOLEY CATH SILVER 14FR (SET/KITS/TRAYS/PACK) ×4 IMPLANT
TROCAR BALLN 12MMX100 BLUNT (TROCAR) ×4 IMPLANT
TROCAR XCEL NON-BLD 11X100MML (ENDOMECHANICALS) IMPLANT
TROCAR XCEL NON-BLD 5MMX100MML (ENDOMECHANICALS) ×4 IMPLANT
TUBING FILTER THERMOFLATOR (ELECTROSURGICAL) IMPLANT
WATER STERILE IRR 1000ML POUR (IV SOLUTION) ×4 IMPLANT

## 2014-09-14 NOTE — Transfer of Care (Signed)
Immediate Anesthesia Transfer of Care Note  Patient: Aimee Mejia  Procedure(s) Performed: Procedure(s): LAPAROSCOPIC ASSISTED VAGINAL HYSTERECTOMY Bilateral Salpingectomy (N/A) CYSTOSCOPY (Bilateral)  Patient Location: PACU  Anesthesia Type:General  Level of Consciousness: awake, alert , oriented and patient cooperative  Airway & Oxygen Therapy: Patient Spontanous Breathing and Patient connected to nasal cannula oxygen  Post-op Assessment: Report given to RN and Post -op Vital signs reviewed and stable  Post vital signs: Reviewed and stable  Last Vitals:  Filed Vitals:   09/14/14 0722  BP: 141/87  Pulse: 76  Temp: 36.7 C  Resp: 20    Complications: No apparent anesthesia complications

## 2014-09-14 NOTE — H&P (View-Only) (Signed)
Aimee Mejia is a 44 y.o. female P 0-2-1-2-1 presents for hysterectomy because of irregular bleeding.  Patient gives a history of menorrhagia during her 3-7 day menstrual flow that over the past 6 months has begun to happen twice a month.  Each bleeding episode requires the patient to wear a Depends  to prevent soiling of clothes and linen.  She admits to clots and cramping rated at 8/10 on a 10 point pain scale but has found relief with Naproxen.  She denies urinary tract symptoms or vaginitis symptoms but admits to dyspareunia and constipation.  A sono-hysterogram April 2016 showed: uterus: (retroflexed) 7.53 x 8.07 x 6.2 cm, endometrium: 0.638 cm, a posterior intramural fibroid 1.8 x 1.4 x 1.6 cm and no focal lesions seen within the endometrial cavity. Both ovaries appeared normal.  A TSH, Prolactin during this evaluation was normal,  Hemoglobin/Hematocrit = 11.6/36.6  and endometrial biopsy returned benign findings.   A review of both medical and surgical management options were given to the patient however, she has decided on definitive therapy in the form of hysterectomy.   Past Medical History  OB History: G: 4;   P 0-2-2-1;   P: C-section 1998  GYN History: menarche: 44 YO    LMP: 08/24/2014    Contracepton abstinence  The patient reports a past history of: chlamydia and herpes.  Denies history of abnormal PAP smear;   Last PAP smear: 06/22/2014-normal  Medical History: NONE  Surgical History: NONE except C-section Denies problems with anesthesia or history of blood transfusions  Family History: Hypertension, Breast Cancer and Diabetes Mellitus  Social History: Married and employed as a Surveyor, mining;  Former smoker and occasionally uses alcohol   Medications:  Valacyclovir 500 mg bid x 3 days prn Naproxen 550 mg   bid prn  Allergies  Allergen Reactions  . Penicillins Hives  . Percocet [Oxycodone-Acetaminophen] Hives   Able to take Vicodin  Denies sensitivity to  peanuts, shellfish, soy, latex or adhesives.  ROS: Admits to glasses and constipation but  denies headache, vision changes, nasal congestion, dysphagia, tinnitus, dizziness, hoarseness, cough,  chest pain, shortness of breath, nausea, vomiting, diarrhea,  urinary frequency, urgency  dysuria, hematuria, vaginitis symptoms, pelvic pain, swelling of joints,easy bruising,  myalgias, arthralgias, skin rashes, unexplained weight loss and except as is mentioned in the history of present illness, patient's review of systems is otherwise negative.     Physical Exam  Bp:  120/74   P: 80   R: 20   Temperature: 98.4 degrees F orally   Weight: 209 lbs.  Height: 5'4"   BMI: 35.9  Neck: supple without masses or thyromegaly Lungs: clear to auscultation Heart: regular rate and rhythm Abdomen: soft, non-tender and no organomegaly Pelvic:EGBUS- wnl; vagina-normal rugae; uterus-normal size, cervix without lesions or motion tenderness; adnexae-no tenderness or masses Extremities:  no clubbing, cyanosis or edema   Assesment:  Irregular Bleeding            Dysmenorrhea   Disposition:  A discussion was held with patient regarding the indication for her procedure(s) along with the risks, which include but are not limited to: reaction to anesthesia, damage to adjacent organs, infection and excessive bleeding.  The patient verbalized understanding of these risks and has consented to proceed with a Laparoscopically Assisted Total Vaginal Hysterectomy with Bilateral Salpingectomy at [facility] on Sep 14, 2014 at 9 a.m.   CSN# [id number]   Shaquel Josephson J. Florene Glen, PA-C  for Dr.  Franklyn Lor. Dillard

## 2014-09-14 NOTE — OR Nursing (Signed)
Dr Lyndle Herrlich states he will sign pt out . Darin Engels rn

## 2014-09-14 NOTE — Anesthesia Postprocedure Evaluation (Signed)
  Anesthesia Post-op Note  Patient: Aimee Mejia  Procedure(s) Performed: Procedure(s): LAPAROSCOPIC ASSISTED VAGINAL HYSTERECTOMY Bilateral Salpingectomy (N/A) CYSTOSCOPY (Bilateral) Patient is awake and responsive. Pain and nausea are reasonably well controlled. Vital signs are stable and clinically acceptable. Oxygen saturation is clinically acceptable. There are no apparent anesthetic complications at this time. Patient is ready for discharge.

## 2014-09-14 NOTE — OR Nursing (Signed)
Waiting for a room to be cleaned for patient

## 2014-09-14 NOTE — Interval H&P Note (Signed)
History and Physical Interval Note:  09/14/2014 8:47 AM  Aimee Mejia  has presented today for surgery, with the diagnosis of Irrregular Cycles, and Intermenstrual bleeding  The various methods of treatment have been discussed with the patient and family. After consideration of risks, benefits and other options for treatment, the patient has consented to  Procedure(s): LAPAROSCOPIC ASSISTED VAGINAL HYSTERECTOMY Bilateral Salpingectomy (N/A) CYSTOSCOPY (Bilateral) as a surgical intervention .  The patient's history has been reviewed, patient examined, no change in status, stable for surgery.  I have reviewed the patient's chart and labs.  Questions were answered to the patient's satisfaction.     [facility]

## 2014-09-14 NOTE — Op Note (Signed)
reop Diagnosis: Symptomatic Fibroids, 58570, poss. [id number]   Postop Diagnosis: Menoorhagia  Procedure: LAVH, B salpingectmy LOA, Cystoscopy  Anesthesia: General   Anesthesiologist: Dr   Attending: Betsy Coder, MD   Assistant: Earnstine Regal PA  Findings: 10 week size fibroid uterus.  Uterine abdominal wall both anterior adhesion.  .  Normal appearing appendix  Pathology: uterus and cervix and bilateral tubes  Fluids: 2900 cccrystalloid  UOP: 200cc  EBL: 185UD  Complications:none  Procedure: The patient was taken to the operating room, placed under general anesthesia and prepped and draped in the normal sterile fashion. A Foley catheter was placed in the bladde. A weighted speculum and vaginal retractors were placed in the vagina. Tenaculum was placed on the anterior lip of the cervix.  A hulka manipulator was placed in the uterus. Attention was then turned to the abdomen. A 10 mm infraumbilical incision was made with the scalpel after 5 cc of 25% percent Marcaine was used for local anesthesia. The subcutaneous tissue was dissected and the fascia was incised with the knife. A purse string stitch was placed in the fascia and Hassan placed into the intra-abdominal cavity and anchored to the suture. Intraabdominal placement was confirmed with the laparoscope.  Two 5 mm trochars were placed in the right and left lower quadrants under direct visualization with the laparoscope. The anterior abdominal wall adhesion was cut . There was a small adhesion from the back of the uterus to the colon that was lysed sharply    .   Both round ligaments were cauterized and cut with the gyrus bipolar cautery as well and the bladder flap created with the tripolar and removed away from the uterus. The left fallopian tube was cauterized and removed from the ovary and side wal. .  T.  The left uterine ovarian ligament was then cauterized and cut.  The right fallopian tube was cauterized cut and removed form the  side wall and ovary.    The right utero-ovarian ligament was cauterized and cut with the tripolar cautery gyrus.  . Attention was then turned to the vagina.  A weighted speculum was placed in the posterior fourchette.  petrussin mixture was placed circumferentially around the cervix.  With blunt and sharp dissection the cervix was dissected away from the bowel and bladder.  Both uterosacral ligaments were clamped, cut and suture ligated and held.  The anterior and posterior culdesac was entered sharply using metzenbaum scissors.  The cardinal ligaments and  The uterine arteries were clamped, cut and suture ligated bilaterally.     .  The uterus was then reduced in size and delivered.  The mcall suture was placed.   The vaginal cuff was closed with interrupted suture of 0 vicryl.   the patient was given indigo carmine.  Cystoscopy was performed and both ureters were seen to efflux indigo carmine without difficulty. The bladder had full integrity with no suture or laceration visualized.  The vagina was inspected and the cuff was noted to be intact.there was some bleeding areas made hemostatic with figure of eight suture using 0 vicryl.    Attention was then turned back to the abdomen after removing top pair of gloves. The abdomen was reinsufflated with CO2 gas.   The abdomen and pelvis was copiously irrigated.  There was some bleeding from the cuff which was made hemostatic with gelfoam and pressures.  .     hemostasis was noted.  gelfoam was placed along the cuff. Using the five mm scope  the umbilical trocar was free from adhesions.     All trochars were removed under direct visualization using the laparoscope.  The umbilical fascia was reapproximated by tying the circumferential suture. The two 5 mm incisions were closed with 3-0 Monocryl via a subcuticular stitch.  All remaining skin incisions were closed with Dermabond and the 10 mm skin incisions were reinforced using Dermabond.  Sponge lap and needle counts were  correct. The vagina was packed with gauze.   The patient tolerated the procedure well and was returned to the PACU in stable condition

## 2014-09-14 NOTE — Anesthesia Procedure Notes (Signed)
Procedure Name: Intubation Date/Time: 09/14/2014 9:20 AM Performed by: Georgeanne Nim Pre-anesthesia Checklist: Patient identified, Patient being monitored, Timeout performed, Emergency Drugs available and Suction available Patient Re-evaluated:Patient Re-evaluated prior to inductionOxygen Delivery Method: Circle system utilized Preoxygenation: Pre-oxygenation with 100% oxygen Intubation Type: IV induction Ventilation: Mask ventilation without difficulty and Oral airway inserted - appropriate to patient size Laryngoscope Size: Mac and 3 Grade View: Grade III Tube type: Oral Tube size: 7.0 mm Number of attempts: 2 Airway Equipment and Method: Video-laryngoscopy (first attempt with mac 3 but unable to see vocal cords well) Placement Confirmation: ETT inserted through vocal cords under direct vision,  breath sounds checked- equal and bilateral,  positive ETCO2 and CO2 detector Secured at: 20 cm Tube secured with: Tape Dental Injury: Teeth and Oropharynx as per pre-operative assessment

## 2014-09-14 NOTE — Anesthesia Preprocedure Evaluation (Signed)
Anesthesia Evaluation  Patient identified by MRN, date of birth, ID band Patient awake    Reviewed: Allergy & Precautions, H&P , Patient's Chart, lab work & pertinent test results, reviewed documented beta blocker date and time   Airway Mallampati: II  TM Distance: >3 FB Neck ROM: full    Dental no notable dental hx.    Pulmonary former smoker,  breath sounds clear to auscultation  Pulmonary exam normal       Cardiovascular Rhythm:regular Rate:Normal     Neuro/Psych    GI/Hepatic   Endo/Other  Morbid obesity  Renal/GU      Musculoskeletal   Abdominal   Peds  Hematology   Anesthesia Other Findings   Reproductive/Obstetrics                             Anesthesia Physical Anesthesia Plan  ASA: III  Anesthesia Plan: General   Post-op Pain Management:    Induction: Intravenous  Airway Management Planned: Oral ETT  Additional Equipment:   Intra-op Plan:   Post-operative Plan: Extubation in OR  Informed Consent: I have reviewed the patients History and Physical, chart, labs and discussed the procedure including the risks, benefits and alternatives for the proposed anesthesia with the patient or authorized representative who has indicated his/her understanding and acceptance.   Dental Advisory Given and Dental advisory given  Plan Discussed with: CRNA and Surgeon  Anesthesia Plan Comments: (  Discussed general anesthesia, including possible nausea, instrumentation of airway, sore throat,pulmonary aspiration, etc. I asked if the were any outstanding questions, or  concerns before we proceeded. )        Anesthesia Quick Evaluation

## 2014-09-15 ENCOUNTER — Encounter (HOSPITAL_COMMUNITY): Payer: Self-pay | Admitting: Obstetrics and Gynecology

## 2014-09-15 DIAGNOSIS — D259 Leiomyoma of uterus, unspecified: Secondary | ICD-10-CM | POA: Diagnosis not present

## 2014-09-15 LAB — BASIC METABOLIC PANEL
Anion gap: 6 (ref 5–15)
BUN: 12 mg/dL (ref 6–20)
CHLORIDE: 101 mmol/L (ref 101–111)
CO2: 29 mmol/L (ref 22–32)
CREATININE: 0.75 mg/dL (ref 0.44–1.00)
Calcium: 8.1 mg/dL — ABNORMAL LOW (ref 8.9–10.3)
GFR calc Af Amer: >60 mL/min (ref >60)
GFR calc non Af Amer: >60 mL/min (ref >60)
GLUCOSE: 105 mg/dL — AB (ref 65–99)
Potassium: 4.4 mmol/L (ref 3.5–5.1)
Sodium: 136 mmol/L (ref 135–145)

## 2014-09-15 LAB — CBC
HCT: 27.6 % — ABNORMAL LOW (ref 36.0–46.0)
Hemoglobin: 8.5 g/dL — ABNORMAL LOW (ref 12.0–15.0)
MCH: 22.8 pg — AB (ref 26.0–34.0)
MCHC: 30.8 g/dL (ref 30.0–36.0)
MCV: 74.2 fL — ABNORMAL LOW (ref 78.0–100.0)
PLATELETS: 268 10*3/uL (ref 150–400)
RBC: 3.72 MIL/uL — ABNORMAL LOW (ref 3.87–5.11)
RDW: 16.8 % — ABNORMAL HIGH (ref 11.5–15.5)
WBC: 11.1 10*3/uL — ABNORMAL HIGH (ref 4.0–10.5)

## 2014-09-15 MED ORDER — HYDROCODONE-ACETAMINOPHEN 5-325 MG PO TABS: 1.0000 | ORAL_TABLET | ORAL | Status: DC | PRN

## 2014-09-15 MED ORDER — IBUPROFEN 600 MG PO TABS: ORAL_TABLET | ORAL | Status: DC

## 2014-09-15 MED ORDER — ONDANSETRON HCL 4 MG PO TABS: 4.0000 mg | ORAL_TABLET | Freq: Three times a day (TID) | ORAL | Status: DC | PRN

## 2014-09-15 NOTE — Anesthesia Postprocedure Evaluation (Signed)
  Anesthesia Post-op Note  Patient: Aimee Mejia  Procedure(s) Performed: Procedure(s): LAPAROSCOPIC ASSISTED VAGINAL HYSTERECTOMY Bilateral Salpingectomy (N/A) CYSTOSCOPY (Bilateral)  Patient Location: PACU and Women's Unit  Anesthesia Type:General  Level of Consciousness: awake, alert  and oriented  Airway and Oxygen Therapy: Patient Spontanous Breathing  Post-op Pain: none  Post-op Assessment: Post-op Vital signs reviewed and Patient's Cardiovascular Status Stable  Post-op Vital Signs: Reviewed and stable  Last Vitals:  Filed Vitals:   09/15/14 0620  BP: 110/72  Pulse: 73  Temp: 36.9 C  Resp: 20    Complications: No apparent anesthesia complications

## 2014-09-15 NOTE — Discharge Instructions (Signed)
Call [facility] @ [phone] if:  You have a temperature greater than or equal to 100.4 degrees Farenheit orally You have pain that is not made better by the pain medication given and taken as directed You have excessive bleeding or problems urinating  Take Colace (Docusate Sodium/Stool Softener) 100 mg 2-3 times daily while taking narcotic pain medicine to avoid constipation or until bowel movements are regular. Purchase and take over the counter Iron Supplement of your choice,  twice a day for the next 12 weeks Take Ibuprofen 600 mg  with food every 6 hours for 5 days then as needed for pain  You may drive after 2 weeks You may walk up steps  You may shower  You may resume a regular diet  Keep incisions clean and dry Do not lift over 15 pounds for 6 weeks Avoid anything in vagina for 6 weeks (or until after your post-operative visit) [place]. This information is not intended to replace advice given to you by your health care provider. Make sure you discuss any questions you have with your health care provider.

## 2014-09-15 NOTE — Discharge Summary (Signed)
Physician Discharge Summary  Patient ID: Aimee Mejia MRN: [id number] DOB/AGE: 1970/10/23 44 y.o.  Admit date: 09/14/2014 Discharge date: 09/15/2014   Discharge Diagnoses: Irregular Bleeding and Dysmenorrhea Active Problems:   Menorrhagia   Operation: Laparoscopically Assisted Vaginal Hysterectomy with Bilateral Salpingectomy   Discharged Condition: Good  Hospital Course: On the date of admission the patient underwent the aforementioned procedures and tolerated them well.  Post operative course was unremarkable with the patient tolerating a hemoglobin of 8.5 and resuming bowel and bladder function by post operative day #1.  Having achieved the maximum benefit of her hospital stay,  the patient was deemed ready for discharge home on post operative day #1.  Disposition: 01-Home or Self Care  Discharge Medications:    Medication List    STOP taking these medications        clindamycin 300 MG capsule  Commonly known as:  CLEOCIN     diclofenac 75 MG EC tablet  Commonly known as:  VOLTAREN     metroNIDAZOLE 0.75 % vaginal gel  Commonly known as:  METROGEL     naproxen sodium 550 MG tablet  Commonly known as:  ANAPROX      TAKE these medications        HYDROcodone-acetaminophen 5-325 MG per tablet  Commonly known as:  NORCO/VICODIN  Take 1-2 tablets by mouth every 4 (four) hours as needed for moderate pain.     ibuprofen 600 MG tablet  Commonly known as:  ADVIL,MOTRIN  1  po  pc every 6 hours for 5 days then prn-pain     ondansetron 4 MG tablet  Commonly known as:  ZOFRAN  Take 1 tablet (4 mg total) by mouth every 8 (eight) hours as needed for nausea or vomiting.     valACYclovir 500 MG tablet  Commonly known as:  VALTREX  Take 500 mg by mouth daily.           Follow-up: Dr. Crawford Givens on October 26, 2014 at 10: 15 a.m.   SignedEarnstine Regal , PA-C  09/15/2014, 7:39 AM

## 2014-09-15 NOTE — Progress Notes (Signed)
Kyrsten Deleeuw is a62 y.o.  [id number]  Post Op Date # 1: LAVH/BS  Subjective: Patient is Doing well postoperatively. Patient has The patient is not having any pain. Ambulating in the halls with only transient lightheadedness upon standing.  Tolerating a regular diet but hasn't voided yet.   Objective: Vital signs in last 24 hours: Temp:  [98.2 F (36.8 C)-99.2 F (37.3 C)] 98.4 F (36.9 C) (05/18 0620) Pulse Rate:  [64-76] 73 (05/18 0620) Resp:  [14-23] 20 (05/18 0620) BP: (101-125)/(65-89) 110/72 mmHg (05/18 0620) SpO2:  [92 %-100 %] 97 % (05/18 0620)  Intake/Output from previous day: 05/17 0701 - 05/18 0700 In: 5195 [P.O.:1170; I.V.:4025] Out: 2500 [Urine:2300] Intake/Output this shift:    Recent Labs Lab 09/15/14 0515  WBC 11.1*  HGB 8.5*  HCT 27.6*  PLT 268     Recent Labs Lab 09/14/14 0730 09/15/14 0515  NA 138 136  K 4.2 4.4  CL 105 101  CO2 25 29  BUN 9 12  CREATININE 0.77 0.75  CALCIUM 8.4* 8.1*  GLUCOSE 110* 105*    EXAM: General: alert, cooperative and no distress Resp: clear to auscultation bilaterally Cardio: regular rate and rhythm, S1, S2 normal, no murmur, click, rub or gallop GI: Bowel sounds present, dressings clean/dry/intact Extremities: Homans sign is negative, no sign of DVT and no calf tenderness. Vaginal Bleeding: none   Assessment: s/p Procedure(s): LAPAROSCOPIC ASSISTED VAGINAL HYSTERECTOMY Bilateral Salpingectomy CYSTOSCOPY: stable, unstable, tolerating diet and anemia  Plan: Encourage ambulation Waiting to void and to begin oral medications Consider discharge later today  Enid Maultsby, PA-C 09/15/2014 7:28 AM

## 2014-09-15 NOTE — Progress Notes (Signed)
Pt is discharged in the care of [place], escort.Discharged instructions with Rx were given to pt with good Stableunderstanding.Questions were asked and answered. Abdominal lapsites are clean and dry. Denies pain or discomfort.

## 2014-09-15 NOTE — Addendum Note (Signed)
Addendum  created 09/15/14 0835 by Hewitt Blade, CRNA   Modules edited: Notes Section   Notes Section:  File: [id number]

## 2017-10-13 ENCOUNTER — Emergency Department (HOSPITAL_COMMUNITY): Payer: Non-veteran care

## 2017-10-13 ENCOUNTER — Encounter (HOSPITAL_COMMUNITY): Payer: Self-pay | Admitting: Emergency Medicine

## 2017-10-13 ENCOUNTER — Other Ambulatory Visit: Payer: Self-pay

## 2017-10-13 ENCOUNTER — Emergency Department (HOSPITAL_COMMUNITY)
Admission: EM | Admit: 2017-10-13 | Discharge: 2017-10-13 | Disposition: A | Discharge status: Home / Self Care | Payer: Non-veteran care | Attending: Emergency Medicine | Admitting: Emergency Medicine

## 2017-10-13 DIAGNOSIS — R079 Chest pain, unspecified: Secondary | ICD-10-CM

## 2017-10-13 DIAGNOSIS — R110 Nausea: Secondary | ICD-10-CM | POA: Diagnosis not present

## 2017-10-13 DIAGNOSIS — R0789 Other chest pain: Secondary | ICD-10-CM | POA: Diagnosis present

## 2017-10-13 DIAGNOSIS — R0602 Shortness of breath: Secondary | ICD-10-CM | POA: Insufficient documentation

## 2017-10-13 DIAGNOSIS — I10 Essential (primary) hypertension: Secondary | ICD-10-CM | POA: Insufficient documentation

## 2017-10-13 DIAGNOSIS — R0781 Pleurodynia: Secondary | ICD-10-CM | POA: Insufficient documentation

## 2017-10-13 DIAGNOSIS — F17210 Nicotine dependence, cigarettes, uncomplicated: Secondary | ICD-10-CM | POA: Diagnosis not present

## 2017-10-13 LAB — CBC
HCT: 43.9 % (ref 36.0–46.0)
Hemoglobin: 14 g/dL (ref 12.0–15.0)
MCH: 29.1 pg (ref 26.0–34.0)
MCHC: 31.9 g/dL (ref 30.0–36.0)
MCV: 91.3 fL (ref 78.0–100.0)
Platelets: 261 10*3/uL (ref 150–400)
RBC: 4.81 MIL/uL (ref 3.87–5.11)
RDW: 14.6 % (ref 11.5–15.5)
WBC: 8.9 10*3/uL (ref 4.0–10.5)

## 2017-10-13 LAB — I-STAT TROPONIN, ED
TROPONIN I, POC: 0 ng/mL (ref 0.00–0.08)
Troponin i, poc: 0.01 ng/mL (ref 0.00–0.08)

## 2017-10-13 LAB — BASIC METABOLIC PANEL
Anion gap: 7 (ref 5–15)
BUN: 12 mg/dL (ref 6–20)
CALCIUM: 9 mg/dL (ref 8.9–10.3)
CO2: 26 mmol/L (ref 22–32)
Chloride: 104 mmol/L (ref 101–111)
Creatinine, Ser: 0.93 mg/dL (ref 0.44–1.00)
Glucose, Bld: 91 mg/dL (ref 65–99)
POTASSIUM: 3.9 mmol/L (ref 3.5–5.1)
SODIUM: 137 mmol/L (ref 135–145)

## 2017-10-13 LAB — I-STAT BETA HCG BLOOD, ED (MC, WL, AP ONLY)

## 2017-10-13 LAB — D-DIMER, QUANTITATIVE: D-Dimer, Quant: 0.33 ug/mL-FEU (ref 0.00–0.50)

## 2017-10-13 MED ORDER — ONDANSETRON HCL 4 MG/2ML IJ SOLN
4.0000 mg | Freq: Once | INTRAMUSCULAR | Status: AC
  Administered 2017-10-13: 4 mg via INTRAVENOUS
  Filled 2017-10-13: qty 2

## 2017-10-13 MED ORDER — NAPROXEN 375 MG PO TABS: 375.0000 mg | ORAL_TABLET | Freq: Two times a day (BID) | ORAL | 0 refills | Status: DC

## 2017-10-13 MED ORDER — MORPHINE SULFATE (PF) 4 MG/ML IV SOLN
4.0000 mg | Freq: Once | INTRAVENOUS | Status: AC
  Administered 2017-10-13: 4 mg via INTRAVENOUS
  Filled 2017-10-13: qty 1

## 2017-10-13 MED ORDER — METHOCARBAMOL 500 MG PO TABS: 500.0000 mg | ORAL_TABLET | Freq: Two times a day (BID) | ORAL | 0 refills | Status: DC

## 2017-10-13 NOTE — ED Notes (Signed)
Spoke with [place] in main lab. Will add on D-dimer with blue blood tube already in lab.

## 2017-10-13 NOTE — ED Provider Notes (Signed)
[facility] DEPARTMENT Provider Note   CSN: [id number] Arrival date & time: 10/13/17  0307     History   Chief Complaint Chief Complaint  Patient presents with  . Chest Pain    HPI Aimee Mejia is a 47 y.o. female.  HPI   Patient is a 50 old female with a history of hypertension presenting for left-sided pleuritic chest pain.  Patient reports that symptoms began 2 days ago, and she does not identify a particular trigger.  Patient reports that it worsened last night to a 10 out of 10.  Patient reports that the pain is at its worse when she takes a deep breath, and it radiates into her left back.  Patient also reports shortness of breath over the past 2 days.  Patient reports nausea but no diaphoresis or abdominal pain.  Patient denies any fevers or chills.  Patient denies any estrogen use, history of DVT/PE, recent immobilization, hospitalization, recent surgery, hemoptysis, leg edema or calf TTP, or cancer history.  Patient does take 2 antihypertensives.  No family history of early MI, early cardiovascular disease, DVT/PE, or aortic aneurysms or dissections.  Patient has never had a stress test or cardiac catheterization.  Patient is a smoker with 10-pack-year history.  Past Medical History:  Diagnosis Date  . Chlamydia    resolved  . Yeast infection    resolved    Patient Active Problem List   Diagnosis Date Noted  . Menorrhagia 09/14/2014    Past Surgical History:  Procedure Laterality Date  . Stella   x 1  . CYSTOSCOPY Bilateral 09/14/2014   Procedure: CYSTOSCOPY;  Surgeon: Crawford Givens, MD;  Location: [facility];  Service: Gynecology;  Laterality: Bilateral;  . LAPAROSCOPIC ASSISTED VAGINAL HYSTERECTOMY N/A 09/14/2014   Procedure: LAPAROSCOPIC ASSISTED VAGINAL HYSTERECTOMY Bilateral Salpingectomy;  Surgeon: Crawford Givens, MD;  Location: [facility];  Service: Gynecology;  Laterality: N/A;  . WISDOM TOOTH EXTRACTION       OB History      Gravida  4   Para  2   Term      Preterm  2   AB  2   Living  1     SAB  2   TAB      Ectopic      Multiple      Live Births               Home Medications    Prior to Admission medications   Medication Sig Start Date End Date Taking? Authorizing Provider  HYDROcodone-acetaminophen (NORCO/VICODIN) 5-325 MG per tablet Take 1-2 tablets by mouth every 4 (four) hours as needed for moderate pain. Patient not taking: Reported on 10/13/2017 09/15/14   Earnstine Regal, PA-C  ibuprofen (ADVIL,MOTRIN) 600 MG tablet 1  po  pc every 6 hours for 5 days then prn-pain Patient not taking: Reported on 10/13/2017 09/15/14   Earnstine Regal, PA-C  ondansetron (ZOFRAN) 4 MG tablet Take 1 tablet (4 mg total) by mouth every 8 (eight) hours as needed for nausea or vomiting. Patient not taking: Reported on 10/13/2017 09/15/14   Earnstine Regal, PA-C    Family History Family History  Problem Relation Age of Onset  . Hypertension Maternal Grandmother   . Diabetes Maternal Grandmother   . Breast cancer Mother     Social History Social History   Tobacco Use  . Smoking status: Former Smoker    Packs/day: 1.00    Years: 30.00  Pack years: 30.00    Types: Cigarettes    Last attempt to quit: 05/31/2014    Years since quitting: 3.3  . Smokeless tobacco: Never Used  Substance Use Topics  . Alcohol use: Yes    Comment: daily  -  beer   . Drug use: No     Allergies   Penicillins   Review of Systems Review of Systems  Constitutional: Negative for chills and fever.  HENT: Negative for congestion and rhinorrhea.   Respiratory: Positive for chest tightness and shortness of breath.   Cardiovascular: Positive for chest pain. Negative for palpitations and leg swelling.  Gastrointestinal: Positive for nausea. Negative for abdominal pain.  Musculoskeletal: Negative for back pain.  Skin: Negative for rash.  Neurological: Negative for dizziness, syncope, weakness and numbness.  All other  systems reviewed and are negative.    Physical Exam Updated Vital Signs BP (!) 175/114 (BP Location: Left Arm)   Pulse 64   Temp 98.1 F (36.7 C) (Oral)   SpO2 98%   Physical Exam  Constitutional: She appears well-developed and well-nourished. No distress.  HENT:  Head: Normocephalic and atraumatic.  Mouth/Throat: Oropharynx is clear and moist.  Eyes: Pupils are equal, round, and reactive to light. Conjunctivae and EOM are normal.  Neck: Normal range of motion. Neck supple.  Cardiovascular: Normal rate, regular rhythm, S1 normal and S2 normal.  No murmur heard. Pulses:      Radial pulses are 2+ on the right side, and 2+ on the left side.       Dorsalis pedis pulses are 2+ on the right side, and 2+ on the left side.  Pulmonary/Chest: Effort normal and breath sounds normal. She has no wheezes. She has no rales.  Abdominal: Soft. She exhibits no distension. There is no tenderness. There is no guarding.  Musculoskeletal: Normal range of motion. She exhibits no edema or deformity.  Lymphadenopathy:    She has no cervical adenopathy.  Neurological: She is alert.  Cranial nerves grossly intact. Patient moves extremities symmetrically and with good coordination.  Skin: Skin is warm and dry. No rash noted. No erythema.  No herpetic rashes of the T4 dermatome  Psychiatric: She has a normal mood and affect. Her behavior is normal. Judgment and thought content normal.  Nursing note and vitals reviewed.    ED Treatments / Results  Labs (all labs ordered are listed, but only abnormal results are displayed) Labs Reviewed  BASIC METABOLIC PANEL  CBC  D-DIMER, QUANTITATIVE (NOT AT [facility])  I-STAT TROPONIN, ED  I-STAT BETA HCG BLOOD, ED ([facility], [facility], [facility] ONLY)  I-STAT TROPONIN, ED    EKG EKG Interpretation  Date/Time:  Sunday October 13 2017 03:14:28 EDT Ventricular Rate:  73 PR Interval:  178 QRS Duration: 84 QT Interval:  418 QTC Calculation: 460 R Axis:   32 Text Interpretation:   Normal sinus rhythm Septal infarct , age undetermined Abnormal ECG No old tracing to compare Confirmed by Merrily Pew [phone]) on 10/13/2017 6:41:39 AM   Radiology Dg Chest 2 View  Result Date: 10/13/2017 CLINICAL DATA:  Left-sided chest pain. EXAM: CHEST - 2 VIEW COMPARISON:  05/18/2009 FINDINGS: The cardiomediastinal contours are normal. The lungs are clear. Pulmonary vasculature is normal. No consolidation, pleural effusion, or pneumothorax. No acute osseous abnormalities are seen. IMPRESSION: No acute pulmonary process. Electronically Signed   By: Jeb Levering M.D.   On: 10/13/2017 04:14    Procedures Procedures (including critical care time)  Medications Ordered in ED Medications  morphine 4 MG/ML injection 4 mg (has no administration in time range)  ondansetron (ZOFRAN) injection 4 mg (has no administration in time range)     Initial Impression / Assessment and Plan / ED Course  I have reviewed the triage vital signs and the nursing notes.  Pertinent labs & imaging results that were available during my care of the patient were reviewed by me and considered in my medical decision making (see chart for details).  Clinical Course as of Oct 14 722  Sun Oct 13, 2017  0654 Blood pressure reassessed, and patient is still hypertensive, but far less than on presentation.   [AM]    Clinical Course User Index [AM] Albesa Seen, PA-C   Differential diagnosis includes ACS, PE, thoracic aortic dissection, Boerhaave's syndrome, cardiac tamponade, pneumothorax, incarcerated diaphragmatic hernia, cholecystitis, esophageal spasm, gastroesophageal reflux, herpes zoster of the thorax, pericarditis, pneumonia, chest wall pain, costochondritis.   Doubt ACS, as delta troponin is negative, initial EKG shows no signs of ischemia, infarction, or arrhythmia, and HEART score 2 (age, risk factors). Doubt PE d-dimer negative, and patient not tachycardic, hypoxic, or tachypneic.  Doubt TAD by hx,  CXR showed no widening mediastinum, and pulses equal in all extremities. Patient remained nontoxic appearing and in no acute distress during emergency department course. Vital signs stable in the emergency department. Therefore, doubt esophageal rupture, cardiac tamponade, or pneumothorax. Pericarditis less likely due to no preceding infectious symptoms and pain not improved in upright positions. No abnormal labs.  Patient is aware of diagnostic uncertainty and was given strict return precautions.  Patient does have pain in a T4 dermatome pattern, however has no evidence of a rash at this point.  I did discuss with the patient the possibility of being in a prodromal herpes zoster phase, and discussed if she were to develop a herpetic rash to the next 48 hours, she should seek care again for antiviral treatment.  At this time, patient will to be a low cardiac risk, and may follow-up with her primary care provider for ED follow-up as well as hypertension management.  Patient can return precautions for any worsening symptoms.  Patient is in understanding and agrees with plan of care.   Final Clinical Impressions(s) / ED Diagnoses   Final diagnoses:  Pleuritic chest pain  Left-sided chest pain  Elevated blood pressure reading with diagnosis of hypertension    ED Discharge Orders        Ordered    naproxen (NAPROSYN) 375 MG tablet  2 times daily     10/13/17 0804    methocarbamol (ROBAXIN) 500 MG tablet  2 times daily     10/13/17 0804       Albesa Seen, PA-C 10/13/17 2025    Blanchie Dessert, MD 10/14/17 [phone]

## 2017-10-13 NOTE — ED Triage Notes (Signed)
Pt states that for 2 days she has had left rib/chest pain that radiates under her arm. With movement it "takes her breath" and she feels like she cannot breathe.;

## 2017-10-13 NOTE — Discharge Instructions (Signed)
Please read and follow all provided instructions.  Your diagnoses today include:  1. Pleuritic chest pain   2. Left-sided chest pain   3. Elevated blood pressure reading with diagnosis of hypertension    Your work-up is very reassuring today.  Given your symptoms, should you develop a rash in the area of your pain within the next 48 hours, it is likely to be shingles. Please return to the ED or see her primary care provider for antiviral should this occur.  There are many causes of chest pain on breathing including pleurisy, which is inflammation of the lining around the lungs.  This can happen due to viral causes.  Tests performed today include: An EKG of your heart A chest x-ray Cardiac enzymes - a blood test for heart muscle damage Blood counts and electrolytes Vital signs. See below for your results today.   Medications prescribed:   Take any prescribed medications only as directed.  You are prescribed Naproxen, a non-steroidal anti-inflammatory agent (NSAID) for pain. You may take 375 mg every 12 hours as needed for pain. If still requiring this medication around the clock for acute pain after 10 days, please see your primary healthcare provider.  Women who are pregnant, breastfeeding, or planning on becoming pregnant should not take non-steroidal anti-inflammatories such as Advil and Aleve. Tylenol is a safe over the counter pain reliever in pregnant women.  You may combine this medication with Tylenol, 650 mg every 6 hours, so you are receiving something for pain every 3 hours.  This is not a long-term medication unless under the care and direction of your primary provider. Taking this medication long-term and not under the supervision of a healthcare provider could increase the risk of stomach ulcers, kidney problems, and cardiovascular problems such as high blood pressure.   You are prescribed Robaxin, a muscle relaxant. Some common side effects of this medication include:  Feeling sleepy.  Dizziness. Take care upon going from a seated to a standing position.  Dry mouth.  Feeling tired or weak.  Hard stools (constipation).  Upset stomach. These are not all of the side effects that may occur. If you have questions about side effects, call your doctor. Call your primary care provider for medical advice about side effects.  This medication can be sedating. Only take this medication as needed. Please do not combine with alcohol. Do not drive or operate machinery while taking this medication.   This medication can interact with some other medications. Make sure to tell any provider you are taking this medication before they prescribe you a new medication.    Follow-up instructions: Please follow-up with your primary care provider as soon as you can for further evaluation of your symptoms.   Return instructions:  SEEK IMMEDIATE MEDICAL ATTENTION IF: You have severe chest pain, especially if the pain is crushing or pressure-like and spreads to the arms, back, neck, or jaw, or if you have sweating, nausea (feeling sick to your stomach), or shortness of breath. THIS IS AN EMERGENCY. Don't wait to see if the pain will go away. Get medical help at once. Call 911 or [phone] (operator). DO NOT drive yourself to the hospital.  Your chest pain gets worse and does not go away with rest.  You have an attack of chest pain lasting longer than usual, despite rest and treatment with the medications your caregiver has prescribed.  You wake from sleep with chest pain or shortness of breath. You feel dizzy or faint. You  have chest pain not typical of your usual pain for which you originally saw your caregiver.  You have any other emergent concerns regarding your health.  Additional Information: Chest pain comes from many different causes. Your caregiver has diagnosed you as having chest pain that is not specific for one problem, but does not require admission.  You are at low risk for an  acute heart condition or other serious illness.   Your vital signs today were: BP (!) 175/114 (BP Location: Left Arm)    Pulse 64    Temp 98.1 F (36.7 C) (Oral)    SpO2 98%  If your blood pressure (BP) was elevated above 135/85 this visit, please have this repeated by your doctor within one month. --------------

## 2019-03-23 IMAGING — DX DG CHEST 2V
2 series · 2 of 2 positions shown · non-contrast
Comparison: 05/18/2009

CLINICAL DATA: Left-sided chest pain.

EXAM:
CHEST - 2 VIEW

[chest pa]
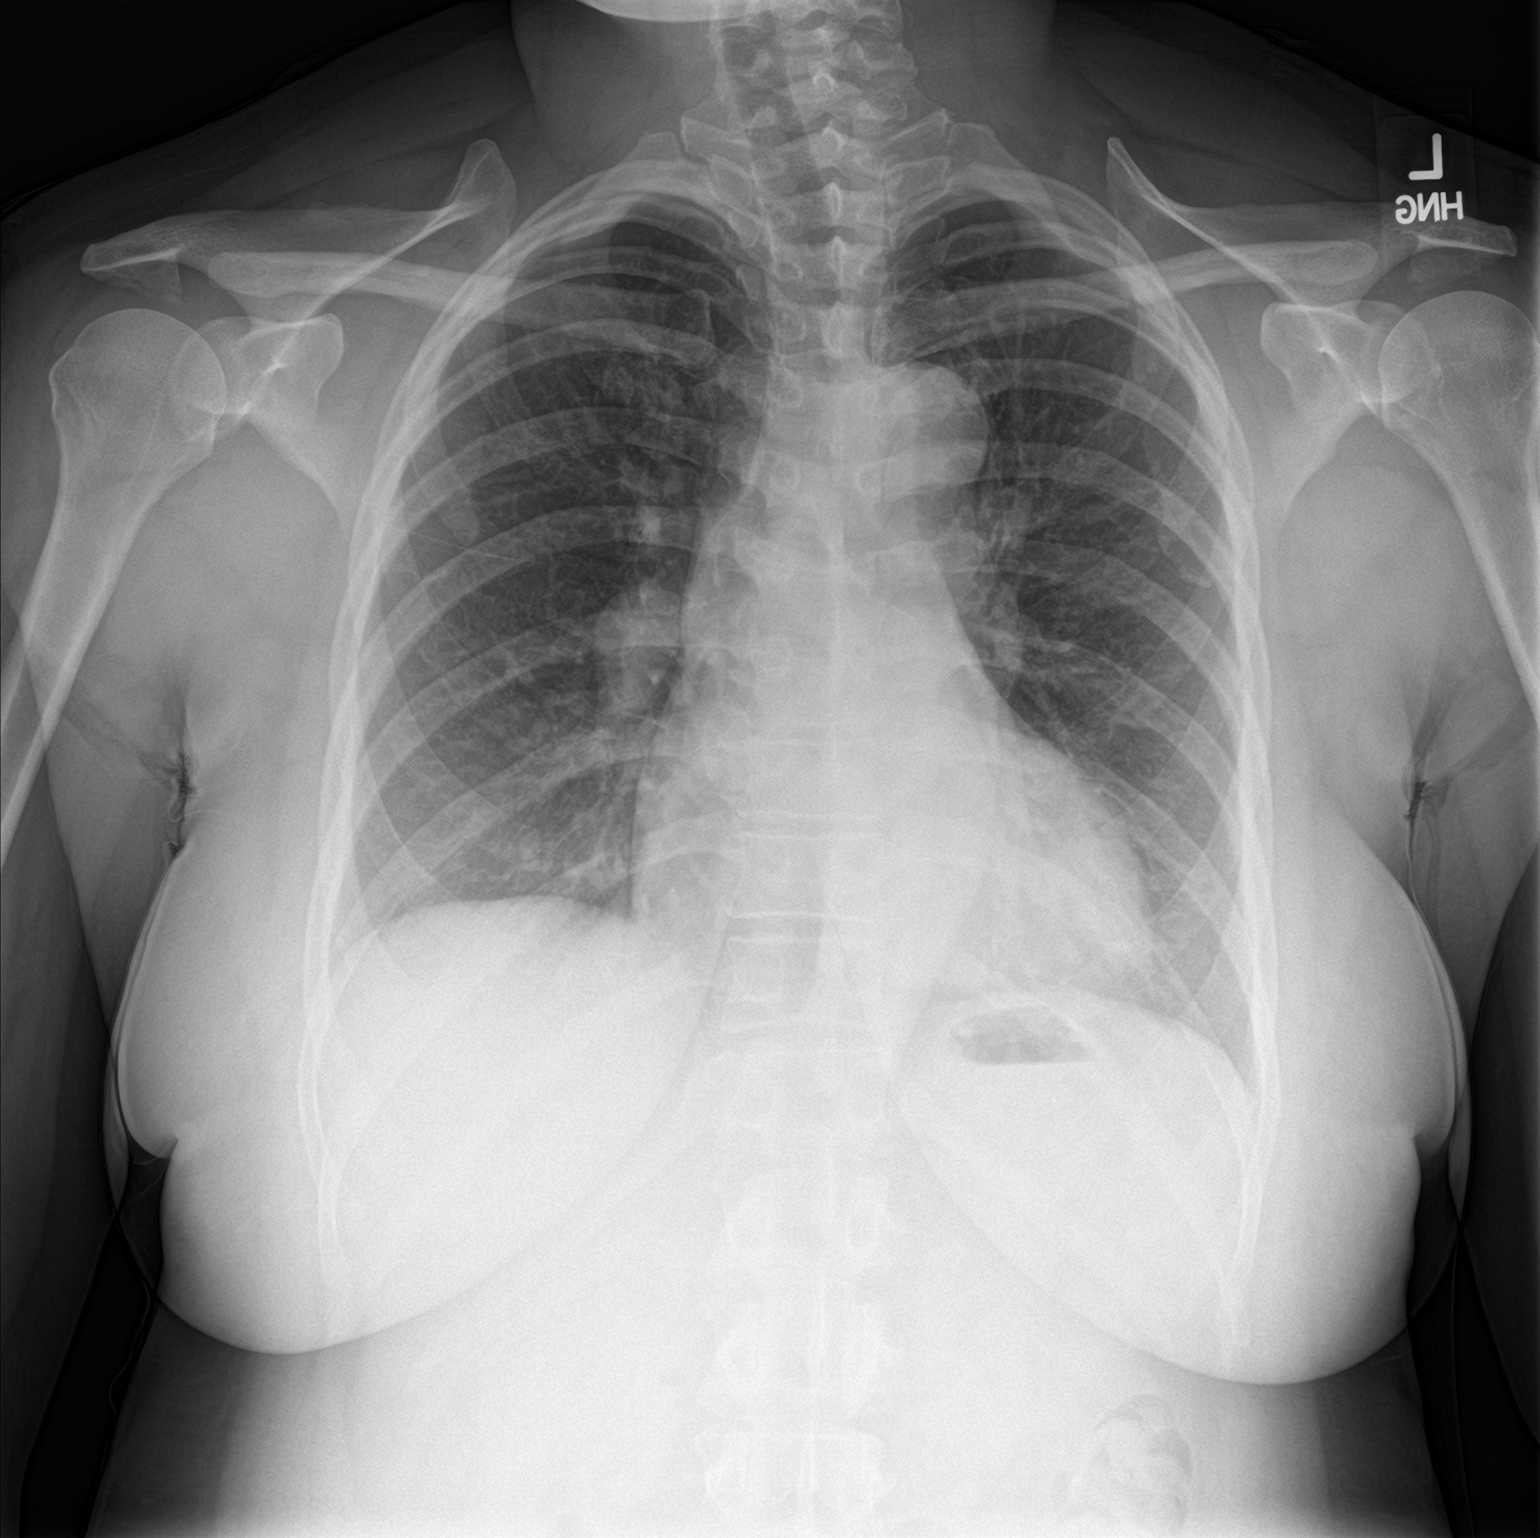

[chest lat]
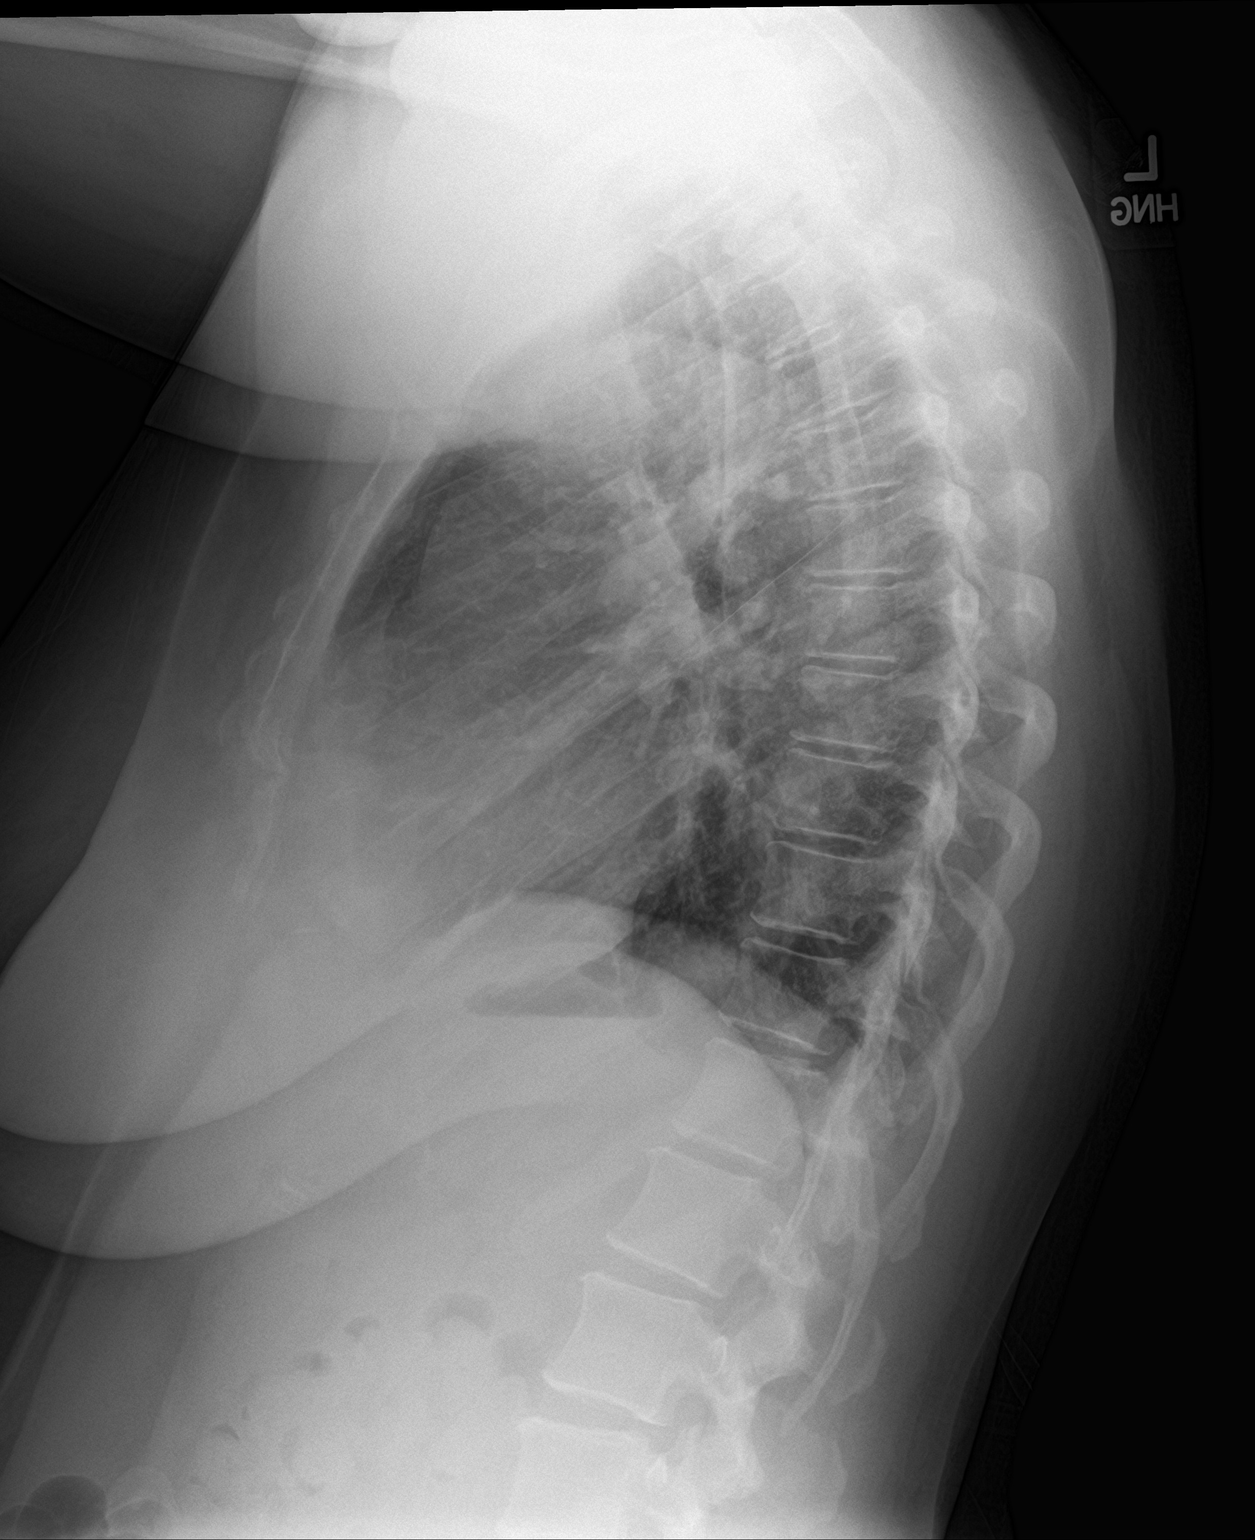

[2 of 2 positions shown; findings below may reference images not displayed]

FINDINGS: The cardiomediastinal contours are normal. The lungs are clear.
Pulmonary vasculature is normal. No consolidation, pleural effusion,
or pneumothorax. No acute osseous abnormalities are seen.
IMPRESSION: No acute pulmonary process.

## 2021-05-31 DIAGNOSIS — J381 Polyp of vocal cord and larynx: Secondary | ICD-10-CM | POA: Insufficient documentation

## 2021-08-31 ENCOUNTER — Encounter (INDEPENDENT_AMBULATORY_CARE_PROVIDER_SITE_OTHER): Payer: Self-pay | Admitting: Bariatrics

## 2021-08-31 ENCOUNTER — Ambulatory Visit (INDEPENDENT_AMBULATORY_CARE_PROVIDER_SITE_OTHER): Payer: No Typology Code available for payment source | Admitting: Bariatrics

## 2021-08-31 VITALS — BP 148/83 | HR 68 | Temp 97.9°F | Ht 64.0 in | Wt 205.0 lb

## 2021-08-31 DIAGNOSIS — R0602 Shortness of breath: Secondary | ICD-10-CM

## 2021-08-31 DIAGNOSIS — R5383 Other fatigue: Secondary | ICD-10-CM

## 2021-08-31 DIAGNOSIS — E668 Other obesity: Secondary | ICD-10-CM

## 2021-08-31 DIAGNOSIS — Z1331 Encounter for screening for depression: Secondary | ICD-10-CM | POA: Diagnosis not present

## 2021-08-31 DIAGNOSIS — Z6835 Body mass index (BMI) 35.0-35.9, adult: Secondary | ICD-10-CM

## 2021-08-31 DIAGNOSIS — I10 Essential (primary) hypertension: Secondary | ICD-10-CM

## 2021-08-31 DIAGNOSIS — K219 Gastro-esophageal reflux disease without esophagitis: Secondary | ICD-10-CM

## 2021-08-31 DIAGNOSIS — G4739 Other sleep apnea: Secondary | ICD-10-CM

## 2021-08-31 DIAGNOSIS — E66812 Obesity, class 2: Secondary | ICD-10-CM

## 2021-08-31 DIAGNOSIS — R7303 Prediabetes: Secondary | ICD-10-CM

## 2021-09-02 NOTE — Progress Notes (Signed)
? ? ? ? ?Chief Complaint:  ? ?OBESITY ?Aimee Mejia (MR# [id number]) is a 51 y.o. female who presents for evaluation and treatment of obesity and related comorbidities. Current BMI is Body mass index is 35.19 kg/m?Aimee Mejia has been struggling with her weight for many years and has been unsuccessful in either losing weight, maintaining weight loss, or reaching her healthy weight goal ? ?Aimee Mejia does like to cook. She craves fried chicken and cassava. She snacks after dinner.  ? ?Aimee Mejia is currently in the action stage of change and ready to dedicate time achieving and maintaining a healthier weight. Aimee Mejia is interested in becoming our patient and working on intensive lifestyle modifications including (but not limited to) diet and exercise for weight loss. ? ?Aimee Mejia's habits were reviewed today and are as follows: Her family eats meals together, she thinks her family will eat healthier with her, her desired weight loss is 30 pounds, she started gaining weight after she stop smoking, her heaviest weight ever was 215 pounds, she has significant food cravings issues, she snacks frequently in the evenings, she wakes up frequently in the middle of the night to eat, she skips meals frequently, she is frequently drinking liquids with calories, she has problems with excessive hunger, and she frequently eats larger portions than normal. ? ?Depression Screen ?[name]'s Food and Mood (modified PHQ-9) score was 9. ? ? ?  08/31/2021  ?  7:57 AM  ?Depression screen PHQ 2/9  ?Decreased Interest 1  ?Down, Depressed, Hopeless 0  ?PHQ - 2 Score 1  ?Altered sleeping 3  ?Tired, decreased energy 2  ?Change in appetite 0  ?Feeling bad or failure about yourself  2  ?Trouble concentrating 0  ?Moving slowly or fidgety/restless 1  ?Suicidal thoughts 0  ?PHQ-9 Score 9  ?Difficult doing work/chores Not difficult at all  ? ?Subjective:  ? ?1. Other fatigue ?Aimee Mejia denies daytime somnolence and denies waking up still tired. Patient has a history of symptoms of  daytime fatigue and morning headache. Aimee Mejia generally gets 7 or 8 hours of sleep per night, and states that she has generally restful sleep. Snoring is present. Apneic episodes is present. Epworth Sleepiness Score is 19.  Aimee Mejia will continue activities.  ? ?2. SOB (shortness of breath) ?Aimee Mejia notes increasing shortness of breath with exercising and seems to be worsening over time with weight gain. She notes getting out of breath sooner with activity than she used to. This has not gotten worse recently. Talonda denies shortness of breath at rest or orthopnea.  ? ?3. Essential hypertension ?Aimee Mejia's blood pressure is controlled. Her blood pressure today was 148/83. ? ?4. Prediabetes ?Aimee Mejia had labs done 07/21/2018. Her A1C was unknown 5.7? ? ?5. Gastroesophageal reflux disease, unspecified whether esophagitis present ?Aimee Mejia is currently taking Omeprazole. She states it is controlled with medications.  ? ?6. Other sleep apnea ?Aimee Mejia uses a CPAP at night.  ? ?Assessment/Plan:  ? ?1. Other fatigue ?Aimee Mejia does feel that her weight is causing her energy to be lower than it should be. Fatigue may be related to obesity, depression or many other causes. Labs will be ordered, and in the meanwhile, Aimee Mejia will focus on self care including making healthy food choices, increasing physical activity and focusing on stress reduction. Aimee Mejia will gradually increase activities and exercise. We will review EKG today.  ? ?- EKG 12-Lead ? ?2. SOB (shortness of breath) ?Aimee Mejia does feel that she gets out of breath more easily that she used to when she exercises. Aimee Mejia's  shortness of breath appears to be obesity related and exercise induced. She has agreed to work on weight loss and gradually increase exercise to treat her exercise induced shortness of breath. Will continue to monitor closely.  ? ?3. Essential hypertension ?Aimee Mejia will continue her medications. She is working on healthy weight loss and exercise to improve blood pressure control. We will watch  for signs of hypotension as she continues her lifestyle modifications. ? ?4. Prediabetes ?Aimee Mejia will keep starches and carbohydrates low. She will continue to work on weight loss, exercise, and decreasing simple carbohydrates to help decrease the risk of diabetes.  ? ?5. Gastroesophageal reflux disease, unspecified whether esophagitis present ?Intensive lifestyle modifications are the first line treatment for this issue. Aimee Mejia will continue taking Omeprazole. We discussed several lifestyle modifications today and she will continue to work on diet, exercise and weight loss efforts. Orders and follow up as documented in patient record.  ? ?Counseling ?If a person has gastroesophageal reflux disease (GERD), food and stomach acid move back up into the esophagus and cause symptoms or problems such as damage to the esophagus. ?Anti-reflux measures include: raising the head of the bed, avoiding tight clothing or belts, avoiding eating late at night, not lying down shortly after mealtime, and achieving weight loss. ?Avoid ASA, NSAID's, caffeine, alcohol, and tobacco.  ?OTC Pepcid and/or Tums are often very helpful for as needed use.  ?However, for persisting chronic or daily symptoms, stronger medications like Omeprazole may be needed. ?You may need to avoid foods and drinks such as: ?Coffee and tea (with or without caffeine). ?Drinks that contain alcohol. ?Energy drinks and sports drinks. ?Bubbly (carbonated) drinks or sodas. ?Chocolate and cocoa. ?Peppermint and mint flavorings. ?Garlic and onions. ?Horseradish. ?Spicy and acidic foods. These include peppers, chili powder, curry powder, vinegar, hot sauces, and BBQ sauce. ?Citrus fruit juices and citrus fruits, such as oranges, lemons, and limes. ?Tomato-based foods. These include red sauce, chili, salsa, and pizza with red sauce. ?Fried and fatty foods. These include donuts, french fries, potato chips, and high-fat dressings. ?High-fat meats. These include hot dogs, rib  eye steak, sausage, ham, and bacon.  ? ?6. Other sleep apnea ?Intensive lifestyle modifications are the first line treatment for this issue. Fleur will continue using her CPAP at night. We discussed several lifestyle modifications today and she will continue to work on diet, exercise and weight loss efforts. We will continue to monitor. Orders and follow up as documented in patient record.   ? ?7. Depression screening ?Monserratt had a positive depression screening. Depression is commonly associated with obesity and often results in emotional eating behaviors. We will monitor this closely and work on CBT to help improve the non-hunger eating patterns. Referral to Psychology may be required if no improvement is seen as she continues in our clinic.  ? ?8. Class 2 severe obesity with serious comorbidity and body mass index (BMI) of 35.0 to 35.9 in adult, unspecified obesity type ([place]) ?Sevannah is currently in the action stage of change and her goal is to continue with weight loss efforts. I recommend Birgit begin the structured treatment plan as follows: ? ?She has agreed to the Category 4 Plan. ? ?Zachary will continue meal planning and she will continue intentional eating. She will work on portion control.  ? ?Exercise goals: No exercise has been prescribed at this time.  ? ?Behavioral modification strategies: increasing lean protein intake, decreasing simple carbohydrates, increasing vegetables, increasing water intake, decreasing eating out, no skipping meals, meal planning  and cooking strategies, keeping healthy foods in the home, and planning for success. ? ?She was informed of the importance of frequent follow-up visits to maximize her success with intensive lifestyle modifications for her multiple health conditions. She was informed we would discuss her lab results at her next visit unless there is a critical issue that needs to be addressed sooner. Dean agreed to keep her next visit at the agreed upon time to discuss these  results. ? ?Objective:  ? ?Blood pressure (!) 148/83, pulse 68, temperature 97.9 ?F (36.6 ?C), height '5\' 4"'$  (1.626 m), weight 205 lb (93 kg), last menstrual period 08/24/2014, SpO2 90 %. Body mass index is 35

## 2021-09-04 ENCOUNTER — Encounter (INDEPENDENT_AMBULATORY_CARE_PROVIDER_SITE_OTHER): Payer: Self-pay | Admitting: Bariatrics

## 2021-09-04 DIAGNOSIS — E6609 Other obesity due to excess calories: Secondary | ICD-10-CM | POA: Insufficient documentation

## 2021-09-20 ENCOUNTER — Encounter (INDEPENDENT_AMBULATORY_CARE_PROVIDER_SITE_OTHER): Payer: Self-pay | Admitting: Bariatrics

## 2021-09-20 ENCOUNTER — Ambulatory Visit (INDEPENDENT_AMBULATORY_CARE_PROVIDER_SITE_OTHER): Payer: No Typology Code available for payment source | Admitting: Bariatrics

## 2021-09-20 VITALS — BP 128/79 | HR 68 | Temp 97.7°F | Ht 64.0 in | Wt 208.0 lb

## 2021-09-20 DIAGNOSIS — R7303 Prediabetes: Secondary | ICD-10-CM | POA: Diagnosis not present

## 2021-09-20 DIAGNOSIS — I10 Essential (primary) hypertension: Secondary | ICD-10-CM | POA: Diagnosis not present

## 2021-09-20 DIAGNOSIS — E669 Obesity, unspecified: Secondary | ICD-10-CM

## 2021-09-20 DIAGNOSIS — R632 Polyphagia: Secondary | ICD-10-CM

## 2021-09-20 DIAGNOSIS — G4739 Other sleep apnea: Secondary | ICD-10-CM

## 2021-09-20 DIAGNOSIS — Z6835 Body mass index (BMI) 35.0-35.9, adult: Secondary | ICD-10-CM

## 2021-09-24 ENCOUNTER — Emergency Department (HOSPITAL_COMMUNITY)
Admission: EM | Admit: 2021-09-24 | Discharge: 2021-09-24 | Disposition: A | Discharge status: Home / Self Care | Payer: No Typology Code available for payment source | Attending: Emergency Medicine | Admitting: Emergency Medicine

## 2021-09-24 ENCOUNTER — Encounter (HOSPITAL_COMMUNITY): Payer: Self-pay | Admitting: Emergency Medicine

## 2021-09-24 ENCOUNTER — Other Ambulatory Visit: Payer: Self-pay

## 2021-09-24 DIAGNOSIS — M5431 Sciatica, right side: Secondary | ICD-10-CM

## 2021-09-24 DIAGNOSIS — M5441 Lumbago with sciatica, right side: Secondary | ICD-10-CM | POA: Insufficient documentation

## 2021-09-24 DIAGNOSIS — M549 Dorsalgia, unspecified: Secondary | ICD-10-CM | POA: Diagnosis present

## 2021-09-24 MED ORDER — PREDNISONE 20 MG PO TABS: ORAL_TABLET | ORAL | 0 refills | Status: DC

## 2021-09-24 NOTE — ED Notes (Signed)
Pt verbalizes understanding of discharge instructions. Opportunity for questions and answers were provided. Pt discharged from the ED.   ?

## 2021-09-24 NOTE — ED Triage Notes (Signed)
C/o lower back pain x 2 months that has been worse x 2 weeks with radiation to R leg.  States she has been dragging her R leg.  Denies incontinence. No known injury.

## 2021-09-24 NOTE — ED Provider Notes (Signed)
[facility] EMERGENCY DEPARTMENT Provider Note   CSN: [id number] Arrival date & time: 09/24/21  1208     History  Chief Complaint  Patient presents with   Back Pain    Aimee Mejia is a 51 y.o. female.   Back Pain Associated symptoms: numbness   Associated symptoms: no weakness    This patient is a 51 year old female presenting with back pain which has been going on for couple of weeks.  It seems to radiate down the right leg and cause her some intermittent numbness.  She has been able to walk, she states it gets worse when she tries to bend over, there is no fevers chills, there is no weakness, there is no IV drug use, no history of cancer, no history of back problems or surgery.  She has been taking some over-the-counter medications, [facility] because of the increasing pain today .  Seems to get better in certain positions. Home Medications Prior to Admission medications   Medication Sig Start Date End Date Taking? Authorizing Provider  predniSONE (DELTASONE) 20 MG tablet '40mg'$  PO daily for 5 days, then '20mg'$  PO daily for 5 days Disp QS 09/24/21  Yes Noemi Chapel, MD  diltiazem (TIAZAC) 240 MG 24 hr capsule TAKE ONE CAPSULE BY MOUTH EVERY MORNING FOR HYPERTENSION 03/14/21   [provider]  omeprazole (PRILOSEC) 40 MG capsule TAKE ONE CAPSULE BY MOUTH TWICE A DAY FOR GERD-SWOLLEN VOCAL CORDS (TAKE ON AN EMPTY STOMACH 30 MINUTES PRIOR TO A MEAL) 05/31/21   [provider]      Allergies    Penicillins    Review of Systems   Review of Systems  Musculoskeletal:  Positive for back pain.  Neurological:  Positive for numbness. Negative for weakness.   Physical Exam Updated Vital Signs BP (!) 148/88 (BP Location: Right Arm)   Pulse 74   Temp 98.3 F (36.8 C) (Oral)   Resp 18   LMP 08/24/2014   SpO2 98%  Physical Exam Vitals and nursing note reviewed.  Constitutional:      Appearance: She is  well-developed. She is not diaphoretic.  HENT:     Head: Normocephalic and atraumatic.  Eyes:     General:        Right eye: No discharge.        Left eye: No discharge.     Conjunctiva/sclera: Conjunctivae normal.  Pulmonary:     Effort: Pulmonary effort is normal. No respiratory distress.  Musculoskeletal:     Comments: Tenderness across the lower back but it is not midline, located in the right buttock as well.  Skin:    General: Skin is warm and dry.     Findings: No erythema or rash.  Neurological:     Mental Status: She is alert.     Coordination: Coordination normal.     Comments: Normal gait, normal strength sensation and reflexes of the bilateral lower extremities    ED Results / Procedures / Treatments   Labs (all labs ordered are listed, but only abnormal results are displayed) Labs Reviewed - No data to display  EKG None  Radiology No results found.  Procedures Procedures    Medications Ordered in ED Medications - No data to display  ED Course/ Medical Decision Making/ A&P  Medical Decision Making Risk Prescription drug management.   Atraumatic lower back pain, seems to have a pattern consistent with sciatica, there is no focal numbness, she has preserved reflexes and strength, will treat with Decadron and supportive care, stable for discharge, referred to spine if no better  Patient agreeable        Final Clinical Impression(s) / ED Diagnoses Final diagnoses:  Sciatica of right side    Rx / DC Orders ED Discharge Orders          Ordered    predniSONE (DELTASONE) 20 MG tablet        09/24/21 1229              Noemi Chapel, MD 09/24/21 1233

## 2021-09-24 NOTE — Discharge Instructions (Signed)
Back Pain:   Your back pain should be treated with medicines such as ibuprofen or aleve and this back pain should get better over the next 2 weeks.  However if you develop severe or worsening pain, low back pain with fever, numbness, weakness or inability to walk or urinate, you should return to the ER immediately.  Please follow up with your doctor this week for a recheck if still having symptoms. Low back pain is discomfort in the lower back that may be due to injuries to muscles and ligaments around the spine.  Occasionally, it may be caused by a a problem to a part of the spine called a disc.  The pain may last several days or a week;  However, most patients get completely well in 4 weeks.  Self - care:  The application of heat can help soothe the pain.  Maintaining your daily activities, including walking, is encourged, as it will help you get better faster than just staying in bed.  Medications are also useful to help with pain control.  A commonly prescribed medications includes acetaminophen.  This medication is generally safe, though you should not take more than 8 of the extra strength ('500mg'$ ) pills a day.  Non steroidal anti inflammatory medications including Ibuprofen and naproxen;  These medications help both pain and swelling and are very useful in treating back pain.  They should be taken with food, as they can cause stomach upset, and more seriously, stomach bleeding.    Muscle relaxants:  These medications can help with muscle tightness that is a cause of lower back pain.  Most of these medications can cause drowsiness, and it is not safe to drive or use dangerous machinery while taking them.  You will need to follow up with  Your primary healthcare provider in 1-2 weeks for reassessment.  Be aware that if you develop new symptoms, such as a fever, leg weakness, difficulty with or loss of control of your urine or bowels, abdominal pain, or more severe pain, you will need to seek  medical attention and  / or return to the Emergency department.  If you do not have a doctor see the list below.  RESOURCE GUIDE  Chronic Pain Problems: Contact [facility]  [phone] Patients need to be referred by their primary care doctor.  Insufficient Money for Medicine: Contact [employer] Way:  call "211" or [place] [phone].  No Primary Care Doctor: Call [facility]  [phone] - can help you locate a primary care doctor that  accepts your insurance, provides certain services, etc. Physician Referral [phone]  Agencies that provide inexpensive medical care: [facility]  [facility]  [phone] [facility]  [phone] [facility]  [phone] [facility]  [phone] [facility]  [phone]  [place] Providers: [facility]- [address], [address]  [phone], Mon-Fri 9am-7pm, Sat 9am-1pm [place], [address] [place]  [place], [address] [place]  [place]- [address]  [place], [address], [phone]  Only accepts [place] patients after they have their name  applied to their card  Self Pay (no insurance) in [facility]: Sickle Cell Patients: Dr Kevan Ny, [facility]  [place], [facility]- [address]  [facility]- [address] [place] [address], [facility]- see information above (Speak to [employer] if you do not have insurance)       -  [facility]- [place], [place],  [place], [place]  Dr Vista Lawman-   [address], [address], [place], [facility]- [address] [phone]       -  [facility] [employer]- [address] [place], [place], also [address] [phone]       -    [facility]- [address], [place], 1st & 3rd Saturday   every month, 10am-1pm  1) Find a Doctor and Pay Out of Pocket Although you won't have to find out who is covered by your insurance plan, it is a good idea to ask around and get recommendations. You will then need to call the office and see if the doctor you have chosen will accept you as a new patient and what types of options they offer for patients who are self-pay. Some doctors offer discounts or will set up payment plans for their patients who do not have insurance, but you will need to ask so you aren't surprised when you get to your appointment.  2) Contact Your Local Health Department Not all health departments have doctors that can see patients for sick visits, but many do, so it is worth a call to see if yours does. If you don't know where your local health department is, you can check in your phone book. The CDC also has a tool to help you locate your state's health department, and many state websites also have listings of all of their local health departments.  3) Find a [facility] If your illness is not likely to be very severe or complicated, you may want to try a walk in clinic. These are popping up all over the country in pharmacies, drugstores, and shopping centers. They're usually staffed by nurse practitioners or physician assistants that have been trained to treat common illnesses and complaints. They're usually fairly quick and inexpensive. However, if you have serious medical issues or chronic medical problems, these are probably not your best option  [facility] [place], [facility], [address], [place], phone [phone] or  [phone].  Monday - Friday, call for an appointment. [place], [facility], [address], [place], phone [phone] or [phone].  Monday - Friday, call for an appointment.  Abuse/Neglect: [place] [phone] [place] [phone] (After Hours)  Emergency Shelter:  [facility] [phone]  Maternity Homes: Room at the [place] [phone] [place] [phone]  MRSA Hotline #:   506 233 9464  [facility] [place] Dept. [address]  [place] Phone:  [phone]                                  Phone:  [phone]                   Phone:  [phone]  [facility], [place]- [phone]       -     [facility] in [place], [address]                                  [place] [phone] or [phone] (After Hours)   [place]  Substance Abuse Resources: Alcohol and Drug Services  [phone] [place] [phone] [facility] [phone] Residential & Outpatient Substance Abuse Program  [phone]  Psychological Services: [place]  [phone] [place]  [place], [place]. [address], [place], [place]: [phone] or [phone], [employer]  Dental Assistance  If unable to pay or uninsured, contact:  [facility] or [facility]. to  become qualified for the adult dental clinic.  Patients with Medicaid: [facility] [phone] [address], [place] [address] [phone]  If unable to pay, or uninsured, contact [facility] [phone]) or [place] [phone] in [place], [place] in [facility]) to become qualified for the adult dental clinic  Other [place]- [place], [place], [place], [address], [place], [place], 2nd and 4th Thursday of the month at 6:30am.  10 clients each day by appointment, can sometimes see walk-in patients if someone does not show for an appointment. [facility]- [address] [place], [place], [address], [place], [place], [place], [address], [place] [facility] [phone]    If you develop severe worsening pain numbness weakness or difficulty urinating return to the emergency department immediately

## 2021-09-26 NOTE — Progress Notes (Unsigned)
Chief Complaint:   OBESITY Aimee Mejia is here to discuss her progress with her obesity treatment plan along with follow-up of her obesity related diagnoses. Aimee Mejia is on the Category 4 Plan and states she is following her eating plan approximately 100% of the time. Aimee Mejia states she is doing 0 minutes 0 times per week.  Today's visit was #: 2 Starting weight: 205 lbs Starting date: 08/31/2021 Today's weight: 208 lbs Today's date: 09/20/2021 Total lbs lost to date: 0 Total lbs lost since last in-office visit: 0  Interim History: Aimee Mejia is up 3 lbs since her last visit. She states that she is following the plan 100%. She states that she started [facility] through her primary care provider.   Subjective:   1. Other sleep apnea Aimee Mejia uses a CPAP at night. She notes restful sleep.   2. Essential hypertension Aimee Mejia's blood pressure is controlled 128/79 today.   3. Polyphagia Aimee Mejia's primary care provider prescribed with more *** in the evening.  4. Pre-diabetes Aimee Mejia's last A1C level was 5.9.  Assessment/Plan:   1. Other sleep apnea Intensive lifestyle modifications are the first line treatment for this issue. Aimee Mejia will continue using her CPAP. We discussed several lifestyle modifications today and she will continue to work on diet, exercise and weight loss efforts. We will continue to monitor. Orders and follow up as documented in patient record.    2. Essential hypertension Aimee Mejia will continue medications. She is working on healthy weight loss and exercise to improve blood pressure control. We will watch for signs of hypotension as she continues her lifestyle modifications.  3. Polyphagia Intensive lifestyle modifications are the first line treatment for this issue. Aimee Mejia will begin taking Wegovy. We discussed several lifestyle modifications today and she will continue to work on diet, exercise and weight loss efforts. Orders and follow up as documented in patient record.  Counseling Polyphagia  is excessive hunger. Causes can include: low blood sugars, hypERthyroidism, PMS, lack of sleep, stress, insulin resistance, diabetes, certain medications, and diets that are deficient in protein and fiber.    4. Pre-diabetes Aimee Mejia will decrease her carbohydrates (sweets and starches). She will decrease ***. She will continue to work on weight loss, exercise, and decreasing simple carbohydrates to help decrease the risk of diabetes.   5. Obesity, Current BMI 35.7 Aimee Mejia is currently in the action stage of change. As such, her goal is to continue with weight loss efforts. She has agreed to the Category 4 Plan.   Aimee Mejia will adhere closely to the plan. We reviewed labs from 07/20/2021 CMP, Lipid panel, and CBC. She will keep water intake high. Dining Out Guide was provided today.   Exercise goals: No exercise has been prescribed at this time.  Behavioral modification strategies: increasing lean protein intake, decreasing simple carbohydrates, increasing vegetables, increasing water intake, decreasing eating out, no skipping meals, meal planning and cooking strategies, keeping healthy foods in the home, and planning for success.  Aimee Mejia has agreed to follow-up with our clinic in 2 weeks. She was informed of the importance of frequent follow-up visits to maximize her success with intensive lifestyle modifications for her multiple health conditions.   Objective:   Blood pressure 128/79, pulse 68, temperature 97.7 F (36.5 C), height '5\' 4"'$  (1.626 m), weight 208 lb (94.3 kg), last menstrual period 08/24/2014, SpO2 91 %. Body mass index is 35.7 kg/m.  General: Cooperative, alert, well developed, in no acute distress. HEENT: Conjunctivae and lids unremarkable. Cardiovascular: Regular rhythm.  Lungs:  Normal work of breathing. Neurologic: No focal deficits.   Lab Results  Component Value Date   CREATININE 0.93 10/13/2017   BUN 12 10/13/2017   NA 137 10/13/2017   K 3.9 10/13/2017   CL 104 10/13/2017    CO2 26 10/13/2017   No results found for: ALT, AST, GGT, ALKPHOS, BILITOT No results found for: HGBA1C No results found for: INSULIN Lab Results  Component Value Date   TSH 0.682 04/21/2012   No results found for: CHOL, HDL, LDLCALC, LDLDIRECT, TRIG, CHOLHDL No results found for: VD25OH Lab Results  Component Value Date   WBC 8.9 10/13/2017   HGB 14.0 10/13/2017   HCT 43.9 10/13/2017   MCV 91.3 10/13/2017   PLT 261 10/13/2017   No results found for: IRON, TIBC, FERRITIN  Attestation Statements:   Reviewed by clinician on day of visit: allergies, medications, problem list, medical history, surgical history, family history, social history, and previous encounter notes.  I, Lizbeth Bark, RMA, am acting as Location manager for [employer], DO.  I have reviewed the above documentation for accuracy and completeness, and I agree with the above. -  ***

## 2021-09-27 ENCOUNTER — Encounter (INDEPENDENT_AMBULATORY_CARE_PROVIDER_SITE_OTHER): Payer: Self-pay | Admitting: Bariatrics

## 2021-09-28 ENCOUNTER — Encounter (INDEPENDENT_AMBULATORY_CARE_PROVIDER_SITE_OTHER): Payer: Self-pay | Admitting: Bariatrics

## 2021-09-28 DIAGNOSIS — Z90710 Acquired absence of both cervix and uterus: Secondary | ICD-10-CM | POA: Insufficient documentation

## 2021-09-28 DIAGNOSIS — I10 Essential (primary) hypertension: Secondary | ICD-10-CM | POA: Insufficient documentation

## 2021-09-28 DIAGNOSIS — K644 Residual hemorrhoidal skin tags: Secondary | ICD-10-CM | POA: Insufficient documentation

## 2021-09-28 DIAGNOSIS — Z87891 Personal history of nicotine dependence: Secondary | ICD-10-CM | POA: Insufficient documentation

## 2021-09-28 DIAGNOSIS — L659 Nonscarring hair loss, unspecified: Secondary | ICD-10-CM | POA: Insufficient documentation

## 2021-10-10 ENCOUNTER — Encounter (INDEPENDENT_AMBULATORY_CARE_PROVIDER_SITE_OTHER): Payer: Self-pay | Admitting: Bariatrics

## 2021-10-10 ENCOUNTER — Ambulatory Visit (INDEPENDENT_AMBULATORY_CARE_PROVIDER_SITE_OTHER): Payer: No Typology Code available for payment source | Admitting: Bariatrics

## 2021-10-10 VITALS — BP 113/74 | HR 68 | Temp 97.9°F | Ht 64.0 in | Wt 209.0 lb

## 2021-10-10 DIAGNOSIS — I10 Essential (primary) hypertension: Secondary | ICD-10-CM

## 2021-10-10 DIAGNOSIS — Z6836 Body mass index (BMI) 36.0-36.9, adult: Secondary | ICD-10-CM

## 2021-10-10 DIAGNOSIS — E669 Obesity, unspecified: Secondary | ICD-10-CM

## 2021-10-11 NOTE — Progress Notes (Signed)
     Chief Complaint:   OBESITY Aimee Mejia is here to discuss her progress with her obesity treatment plan along with follow-up of her obesity related diagnoses. Aimee Mejia is on the Category 4 Plan and states she is following her eating plan approximately 0% of the time. Aimee Mejia states she is doing 0 minutes 0 times per week.  Today's visit was #: 3 Starting weight: 205 lbs Starting date: 08/31/2021 Today's weight: 209 lbs Today's date: 10/10/2021 Total lbs lost to date: 0 Total lbs lost since last in-office visit: 0  Interim History: Aimee Mejia is up 1 lb after a round of prednisone. She states that she started on Contrave. She has had increased hunger with the prednisone.   Subjective:   1. Benign essential hypertension Aimee Mejia is currently taking Tiazac. Her blood pressure is controlled. He blood pressure today is 113/74.  Assessment/Plan:   1. Benign essential hypertension Aimee Mejia will continue Tiazac. She is working on healthy weight loss and exercise to improve blood pressure control. We will watch for signs of hypotension as she continues her lifestyle modifications.  2. Obesity, Current BMI 36.0 Aimee Mejia is currently in the action stage of change. As such, her goal is to continue with weight loss efforts. She has agreed to the Category 4 Plan plus high protein 120 grams.   Aimee Mejia will continue meal planning and she will continue intentional eating. She will continue with the Contrave. Eating Out handout was provided today.   Exercise goals:  Aimee Mejia notes sciatica pain. She is on prednisone.   Behavioral modification strategies: increasing lean protein intake, decreasing simple carbohydrates, increasing vegetables, increasing water intake, decreasing eating out, no skipping meals, meal planning and cooking strategies, keeping healthy foods in the home, and planning for success.  Aimee Mejia has agreed to follow-up with our clinic in 2-3 weeks. She was informed of the importance of frequent follow-up visits to  maximize her success with intensive lifestyle modifications for her multiple health conditions.   Objective:   Blood pressure 113/74, pulse 68, temperature 97.9 F (36.6 C), height '5\' 4"'$  (1.626 m), weight 209 lb (94.8 kg), last menstrual period 08/24/2014, SpO2 97 %. Body mass index is 35.87 kg/m.  General: Cooperative, alert, well developed, in no acute distress. HEENT: Conjunctivae and lids unremarkable. Cardiovascular: Regular rhythm.  Lungs: Normal work of breathing. Neurologic: No focal deficits.   Lab Results  Component Value Date   CREATININE 0.93 10/13/2017   BUN 12 10/13/2017   NA 137 10/13/2017   K 3.9 10/13/2017   CL 104 10/13/2017   CO2 26 10/13/2017   No results found for: "ALT", "AST", "GGT", "ALKPHOS", "BILITOT" No results found for: "HGBA1C" No results found for: "INSULIN" Lab Results  Component Value Date   TSH 0.682 04/21/2012   No results found for: "CHOL", "HDL", "LDLCALC", "LDLDIRECT", "TRIG", "CHOLHDL" No results found for: "VD25OH" Lab Results  Component Value Date   WBC 8.9 10/13/2017   HGB 14.0 10/13/2017   HCT 43.9 10/13/2017   MCV 91.3 10/13/2017   PLT 261 10/13/2017   No results found for: "IRON", "TIBC", "FERRITIN"  Attestation Statements:   Reviewed by clinician on day of visit: allergies, medications, problem list, medical history, surgical history, family history, social history, and previous encounter notes.  I, Lizbeth Bark, RMA, am acting as Location manager for [employer], DO.  I have reviewed the above documentation for accuracy and completeness, and I agree with the above. Jearld Lesch, DO

## 2021-10-17 ENCOUNTER — Encounter (INDEPENDENT_AMBULATORY_CARE_PROVIDER_SITE_OTHER): Payer: Self-pay | Admitting: Bariatrics

## 2021-11-06 ENCOUNTER — Ambulatory Visit (INDEPENDENT_AMBULATORY_CARE_PROVIDER_SITE_OTHER): Payer: No Typology Code available for payment source | Admitting: Bariatrics

## 2021-11-06 ENCOUNTER — Encounter (INDEPENDENT_AMBULATORY_CARE_PROVIDER_SITE_OTHER): Payer: Self-pay | Admitting: Bariatrics

## 2021-11-06 VITALS — BP 122/75 | HR 69 | Temp 98.3°F | Ht 64.0 in | Wt 208.0 lb

## 2021-11-06 DIAGNOSIS — Z6835 Body mass index (BMI) 35.0-35.9, adult: Secondary | ICD-10-CM

## 2021-11-06 DIAGNOSIS — I10 Essential (primary) hypertension: Secondary | ICD-10-CM | POA: Diagnosis not present

## 2021-11-06 DIAGNOSIS — E669 Obesity, unspecified: Secondary | ICD-10-CM

## 2021-11-06 DIAGNOSIS — R632 Polyphagia: Secondary | ICD-10-CM

## 2021-11-07 NOTE — Progress Notes (Unsigned)
Chief Complaint:   OBESITY Aimee Mejia is here to discuss her progress with her obesity treatment plan along with follow-up of her obesity related diagnoses. Aimee Mejia is on the Category 4 Plan with 120 grams of protein and states she is following her eating plan approximately 50% of the time. Aimee Mejia states she is walking and lifting weights for 30 minutes 5 times per week.  Today's visit was #: 4 Starting weight: 205 lbs Starting date: 08/31/2021 Today's weight: 208 lbs Today's date: 11/06/2021 Total lbs lost to date: 0 Total lbs lost since last in-office visit: 1  Interim History: Aimee Mejia is down 1 additional pound since her last visit.  She is taking the Contrave and she is taking 1 tablet in the morning and 1 tablet in the evening.  She will begin tomorrow on her full dose.  Subjective:   1. Benign essential hypertension Aimee Mejia's blood pressure is controlled today.  2. Polyphagia Aimee Mejia is drinking more water.  Assessment/Plan:   1. Benign essential hypertension Aimee Mejia will continue her medications as directed.  We will follow-up at her next visit.  2. Polyphagia Aimee Mejia will continue to increase her water and protein intake, and she will continue Contrave as directed.  3. Obesity, Current BMI 35.8 Aimee Mejia is currently in the action stage of change. As such, her goal is to continue with weight loss efforts. She has agreed to the Category 3 Plan with 120 grams of protein.   Meal planning and intentional eating were discussed.  She will continue the Contrave (not effective so far).  Exercise goals: As is.  Behavioral modification strategies: increasing lean protein intake, decreasing simple carbohydrates, increasing vegetables, increasing water intake, decreasing eating out, no skipping meals, meal planning and cooking strategies, keeping healthy foods in the home, and planning for success.  Aimee Mejia has agreed to follow-up with our clinic in 2 to 3 weeks. She was informed of the importance of frequent  follow-up visits to maximize her success with intensive lifestyle modifications for her multiple health conditions.   Objective:   Blood pressure 122/75, pulse 69, temperature 98.3 F (36.8 C), height '5\' 4"'$  (1.626 m), weight 208 lb (94.3 kg), last menstrual period 08/24/2014, SpO2 97 %. Body mass index is 35.7 kg/m.  General: Cooperative, alert, well developed, in no acute distress. HEENT: Conjunctivae and lids unremarkable. Cardiovascular: Regular rhythm.  Lungs: Normal work of breathing. Neurologic: No focal deficits.   Lab Results  Component Value Date   CREATININE 0.93 10/13/2017   BUN 12 10/13/2017   NA 137 10/13/2017   K 3.9 10/13/2017   CL 104 10/13/2017   CO2 26 10/13/2017   No results found for: "ALT", "AST", "GGT", "ALKPHOS", "BILITOT" No results found for: "HGBA1C" No results found for: "INSULIN" Lab Results  Component Value Date   TSH 0.682 04/21/2012   No results found for: "CHOL", "HDL", "LDLCALC", "LDLDIRECT", "TRIG", "CHOLHDL" No results found for: "VD25OH" Lab Results  Component Value Date   WBC 8.9 10/13/2017   HGB 14.0 10/13/2017   HCT 43.9 10/13/2017   MCV 91.3 10/13/2017   PLT 261 10/13/2017   No results found for: "IRON", "TIBC", "FERRITIN"  Attestation Statements:   Reviewed by clinician on day of visit: allergies, medications, problem list, medical history, surgical history, family history, social history, and previous encounter notes.   Wilhemena Durie, am acting as Location manager for [employer], DO.  I have reviewed the above documentation for accuracy and completeness, and I agree with the above. -  Jearld Lesch, DO

## 2021-11-08 ENCOUNTER — Ambulatory Visit (INDEPENDENT_AMBULATORY_CARE_PROVIDER_SITE_OTHER): Payer: Non-veteran care | Admitting: Family Medicine

## 2021-11-08 ENCOUNTER — Encounter (INDEPENDENT_AMBULATORY_CARE_PROVIDER_SITE_OTHER): Payer: Self-pay | Admitting: Bariatrics

## 2021-12-06 ENCOUNTER — Encounter (INDEPENDENT_AMBULATORY_CARE_PROVIDER_SITE_OTHER): Payer: Self-pay

## 2021-12-11 ENCOUNTER — Ambulatory Visit (INDEPENDENT_AMBULATORY_CARE_PROVIDER_SITE_OTHER): Payer: Non-veteran care | Admitting: Bariatrics

## 2021-12-11 ENCOUNTER — Encounter (INDEPENDENT_AMBULATORY_CARE_PROVIDER_SITE_OTHER): Payer: Self-pay | Admitting: Family Medicine

## 2021-12-11 ENCOUNTER — Telehealth (INDEPENDENT_AMBULATORY_CARE_PROVIDER_SITE_OTHER): Payer: No Typology Code available for payment source | Admitting: Family Medicine

## 2021-12-11 DIAGNOSIS — R7303 Prediabetes: Secondary | ICD-10-CM | POA: Insufficient documentation

## 2021-12-11 DIAGNOSIS — E669 Obesity, unspecified: Secondary | ICD-10-CM

## 2021-12-11 DIAGNOSIS — I10 Essential (primary) hypertension: Secondary | ICD-10-CM

## 2021-12-11 DIAGNOSIS — Z6835 Body mass index (BMI) 35.0-35.9, adult: Secondary | ICD-10-CM

## 2021-12-11 MED ORDER — OZEMPIC (0.25 OR 0.5 MG/DOSE) 2 MG/3ML ~~LOC~~ SOPN: 0.2500 mg | PEN_INJECTOR | SUBCUTANEOUS | 0 refills | Status: DC

## 2021-12-11 NOTE — Progress Notes (Signed)
TeleHealth Visit:  This visit was completed with telemedicine (audio/video) technology. Aimee Mejia has verbally consented to this TeleHealth visit. The patient is located at home, the provider is located at home. The participants in this visit include the listed provider and patient. The visit was conducted today via MyChart video.  OBESITY Aimee Mejia is here to discuss her progress with her obesity treatment plan along with follow-up of her obesity related diagnoses.   Today's visit was # 5 Starting weight: 205 lbs Starting date: 08/31/2021 Weight at last in office visit: 208 lbs on 11/06/21 Total weight loss: 0 lbs at last in office visit on 11/06/21. Today's reported weight:  No weight reported.  Nutrition Plan: the Category 3 Plan - none Hunger is poorly controlled. Cravings are poorly controlled.  Current exercise: Aimee Mejia states she is walking and lifting weights for 30 minutes 5 times per week.  Interim History: Aimee Mejia reports hunger and cravings have been an issue for her.  She had been on steroids but has now completed the prescription. She has not been following a plan recently.  She had the complete category 3 breakfast with 3 eggs and toast she found herself hungry very quickly. She does  an excellent job with water intake.  She was prescribed Contrave recently but reports it did not suppress her appetite. It looks like Mancel Parsons was approved according to the notes from the [place] in care everywhere.  However there are availability issues.  Assessment/Plan:  1. Prediabetes Recent A1c was 5.9 on 07/20/2021 at her PCP. Medication(s): none No results found for: "HGBA1C" No results found for: "INSULIN"  Plan: Discontinue Wegovy. New prescription for Ozempic 0.25 mg weekly. Aimee Mejia denies personal or family history of thyroid cancer, history of pancreatitis, or current cholelithiasis. Aimee Mejia was informed of the most common side effects (nausea, constipation, diarrhea).  2.  Hypertension Hypertension well controlled.  Medication(s): Diltiazem 24-hour capsule 240 mg daily.  Chlorthalidone 12.5 mg daily recently added by her PCP  BP Readings from Last 3 Encounters:  11/06/21 122/75  10/10/21 113/74  09/24/21 (!) 148/88   Lab Results  Component Value Date   CREATININE 0.93 10/13/2017   CREATININE 0.75 09/15/2014   CREATININE 0.77 09/14/2014    Plan: Continue diltiazem 24-hour capsule 240 mg daily and chlorthalidone 12.5 mg daily.   3. Obesity: Current BMI 35.7 Aimee Mejia is currently in the action stage of change. As such, her goal is to continue with weight loss efforts.  She has agreed to the Category 3 Plan.   Exercise goals: as is  Behavioral modification strategies: increasing lean protein intake and decreasing simple carbohydrates.  Aimee Mejia has agreed to follow-up with our clinic in 4 weeks.   No orders of the defined types were placed in this encounter.   Medications Discontinued During This Encounter  Medication Reason   Naltrexone-buPROPion HCl ER 8-90 MG TB12 Discontinued by provider     Meds ordered this encounter  Medications   Semaglutide,0.25 or 0.'5MG'$ /DOS, (OZEMPIC, 0.25 OR 0.5 MG/DOSE,) 2 MG/3ML SOPN    Sig: Inject 0.25 mg into the skin once a week.    Dispense:  3 mL    Refill:  0    Order Specific Question:   Supervising Provider    Answer:   Dennard Nip D [[id number]]      Objective:   VITALS: Per patient if applicable, see vitals. GENERAL: Alert and in no acute distress. CARDIOPULMONARY: No increased WOB. Speaking in clear sentences.  PSYCH: Pleasant and cooperative. Speech normal rate  and rhythm. Affect is appropriate. Insight and judgement are appropriate. Attention is focused, linear, and appropriate.  NEURO: Oriented as arrived to appointment on time with no prompting.   Lab Results  Component Value Date   CREATININE 0.93 10/13/2017   BUN 12 10/13/2017   NA 137 10/13/2017   K 3.9 10/13/2017   CL 104 10/13/2017    CO2 26 10/13/2017   No results found for: "ALT", "AST", "GGT", "ALKPHOS", "BILITOT" No results found for: "HGBA1C" No results found for: "INSULIN" Lab Results  Component Value Date   TSH 0.682 04/21/2012   No results found for: "CHOL", "HDL", "LDLCALC", "LDLDIRECT", "TRIG", "CHOLHDL" Lab Results  Component Value Date   WBC 8.9 10/13/2017   HGB 14.0 10/13/2017   HCT 43.9 10/13/2017   MCV 91.3 10/13/2017   PLT 261 10/13/2017   No results found for: "IRON", "TIBC", "FERRITIN" No results found for: "VD25OH"  Attestation Statements:   Reviewed by clinician on day of visit: allergies, medications, problem list, medical history, surgical history, family history, social history, and previous encounter notes.

## 2022-01-08 ENCOUNTER — Ambulatory Visit: Payer: No Typology Code available for payment source | Admitting: Bariatrics

## 2022-01-24 ENCOUNTER — Emergency Department (HOSPITAL_COMMUNITY)
Admission: EM | Admit: 2022-01-24 | Discharge: 2022-01-24 | Discharge status: Against Medical Advice | Payer: No Typology Code available for payment source

## 2022-01-24 NOTE — ED Notes (Signed)
Patient states she is leaving d/t wait time 

## 2022-01-25 NOTE — Progress Notes (Signed)
TeleHealth Visit:  This visit was completed with telemedicine (audio/video) technology. Sareen has verbally consented to this TeleHealth visit. The patient is located at home, the provider is located at home. The participants in this visit include the listed provider and patient. The visit was conducted today via MyChart video.  OBESITY Rainee is here to discuss her progress with her obesity treatment plan along with follow-up of her obesity related diagnoses.   Today's visit was # 6 Starting weight: 205 lbs Starting date: 08/31/2021 Weight at last in office visit: 208 lbs on 11/06/21 Total weight loss: 0 lbs at last in office visit on 11/06/21. Today's reported weight: 202.6 lbs   Nutrition Plan: the Category 3 Plan.   Current exercise: Jeannett states she is walking and lifting weights for 30 minutes 3 times per week.  Interim History: Enrika was able to get Ozempic prescribed through the [place] for free.  She started 0.5 mg dose on 01/10/2022 at 214.6 pounds.  Today she is down 12 pounds from that. She is still eating regular meals and mostly getting the protein in although she gets only 6 ounces protein at dinner. She has to remind herself to eat.  She says she is sticking to the plan better. 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  Exercise frequency is decreased but still 3 times per week. Water intake is excellent.  Assessment/Plan:  1. Prediabetes A1c was elevated at 5.9 on 07/20/2021. Medication(s): Ozempic 0.5 mg daily-prescribed by [place].  Notes constipation. No results found for: "HGBA1C" No results found for: "INSULIN" Appetite very well controlled.  Plan: Continue Ozempic 0.5 mg weekly.  2. Constipation Tessla notes constipation.   This is likely related to GLP-1.  Has not been taking any medication for this. Constipation is poorly controlled.   Plan: Continue good water intake. Add MiraLAX once daily.  May take  twice a day or every other day based on results.  3. Obesity: Current BMI 35.7 Sherea is currently in the action stage of change. As such, her goal is to continue with weight loss efforts.  She has agreed to the Category 3 Plan.   Advised her to choose one of the options off the protein equivalent handout to make up protein from dinner. Advise she must continue to have 3 meals per day and get in adequate protein despite not being hungry.  Exercise goals: as is  Behavioral modification strategies: increasing lean protein intake, meal planning and cooking strategies, better snacking choices, and planning for success.  Kemyah has agreed to follow-up with our clinic in 3 weeks.   No orders of the defined types were placed in this encounter.   Medications Discontinued During This Encounter  Medication Reason   Semaglutide,0.25 or 0.'5MG'$ /DOS, (OZEMPIC, 0.25 OR 0.5 MG/DOSE,) 2 HD/6QI SOPN Duplicate     No orders of the defined types were placed in this encounter.     Objective:   VITALS: Per patient if applicable, see vitals. GENERAL: Alert and in no acute distress. CARDIOPULMONARY: No increased WOB. Speaking in clear sentences.  PSYCH: Pleasant and cooperative. Speech normal rate and rhythm. Affect is appropriate. Insight and judgement are appropriate. Attention is focused, linear, and appropriate.  NEURO: Oriented as arrived to appointment on time with no prompting.   Lab Results  Component Value Date   CREATININE 0.93 10/13/2017   BUN 12 10/13/2017   NA 137 10/13/2017   K 3.9 10/13/2017   CL 104 10/13/2017  CO2 26 10/13/2017   No results found for: "ALT", "AST", "GGT", "ALKPHOS", "BILITOT" No results found for: "HGBA1C" No results found for: "INSULIN" Lab Results  Component Value Date   TSH 0.682 04/21/2012   No results found for: "CHOL", "HDL", "LDLCALC", "LDLDIRECT", "TRIG", "CHOLHDL" Lab Results  Component Value Date   WBC 8.9 10/13/2017   HGB 14.0 10/13/2017   HCT  43.9 10/13/2017   MCV 91.3 10/13/2017   PLT 261 10/13/2017   No results found for: "IRON", "TIBC", "FERRITIN" No results found for: "VD25OH"  Attestation Statements:   Reviewed by clinician on day of visit: allergies, medications, problem list, medical history, surgical history, family history, social history, and previous encounter notes.  Time spent on visit including the items listed below was 32 minutes.  -preparing to see the patient (e.g., review of tests, history, previous notes) -obtaining and/or reviewing separately obtained history -counseling and educating the patient/family/caregiver -documenting clinical information in the electronic or other health record -ordering medications, tests, or procedures

## 2022-01-29 ENCOUNTER — Encounter (INDEPENDENT_AMBULATORY_CARE_PROVIDER_SITE_OTHER): Payer: Self-pay | Admitting: Family Medicine

## 2022-01-29 ENCOUNTER — Telehealth (INDEPENDENT_AMBULATORY_CARE_PROVIDER_SITE_OTHER): Payer: No Typology Code available for payment source | Admitting: Family Medicine

## 2022-01-29 DIAGNOSIS — E669 Obesity, unspecified: Secondary | ICD-10-CM | POA: Diagnosis not present

## 2022-01-29 DIAGNOSIS — K5909 Other constipation: Secondary | ICD-10-CM

## 2022-01-29 DIAGNOSIS — R7303 Prediabetes: Secondary | ICD-10-CM | POA: Diagnosis not present

## 2022-01-29 DIAGNOSIS — Z6835 Body mass index (BMI) 35.0-35.9, adult: Secondary | ICD-10-CM

## 2022-02-19 ENCOUNTER — Telehealth (INDEPENDENT_AMBULATORY_CARE_PROVIDER_SITE_OTHER): Payer: No Typology Code available for payment source | Admitting: Family Medicine

## 2022-02-19 ENCOUNTER — Encounter (INDEPENDENT_AMBULATORY_CARE_PROVIDER_SITE_OTHER): Payer: Self-pay | Admitting: Family Medicine

## 2022-02-19 DIAGNOSIS — Z6835 Body mass index (BMI) 35.0-35.9, adult: Secondary | ICD-10-CM | POA: Diagnosis not present

## 2022-02-19 DIAGNOSIS — R632 Polyphagia: Secondary | ICD-10-CM | POA: Diagnosis not present

## 2022-02-19 DIAGNOSIS — E669 Obesity, unspecified: Secondary | ICD-10-CM | POA: Diagnosis not present

## 2022-03-05 NOTE — Progress Notes (Unsigned)
TeleHealth Visit:  Due to the COVID-19 pandemic, this visit was completed with telemedicine (audio/video) technology to reduce patient and provider exposure as well as to preserve personal protective equipment.   Aimee Mejia has verbally consented to this TeleHealth visit. The patient is located at home, the provider is located at the [facility] office. The participants in this visit include the listed provider and patient. The visit was conducted today via MyChart video.   Chief Complaint: OBESITY Aimee Mejia is here to discuss her progress with her obesity treatment plan along with follow-up of her obesity related diagnoses. Aimee Mejia is on the Category 3 Plan and states she is following her eating plan approximately 100% of the time. Aimee Mejia states she is doing exercises at home.   Today's visit was #: 7 Starting weight: 205 lbs Starting date: 08/31/2021  Interim History: This is the patient's for his office visit with me, as she has been seen by Dr. Owens Shark prior.  For breakfast, she is eating yogurt, string cheese, and on some days a cheese sandwich.  For lunch, she is skipping regularly.  For dinner, she notes she did not eat dinner last night.  Subjective:   1. Polyphagia Patient is on Ozempic per the [place], and she was unable to eat much at all.  She is on it for prediabetes.  Assessment/Plan:  No orders of the defined types were placed in this encounter.   There are no discontinued medications.   No orders of the defined types were placed in this encounter.    1. Polyphagia I recommended the patient to not continually increase the dose of her medications (Ozempic) due to current symptoms of anorexia and often only eating 1 time per day.  She will continue her prudent nutritional plan and exercise.  I recommended the patient to have an in-person office visit next time.  2. Obesity, Current BMI 35.7 Aimee Mejia is currently in the action stage of change. As such, her goal is to continue  with weight loss efforts. She has agreed to the Category 3 Plan.   Exercise goals: As is.   Behavioral modification strategies: increasing lean protein intake, no skipping meals, and planning for success.  Aimee Mejia has agreed to follow-up with our clinic in 2 to 3 weeks in office. She was informed of the importance of frequent follow-up visits to maximize her success with intensive lifestyle modifications for her multiple health conditions.  Objective:   VITALS: Per patient if applicable, see vitals. GENERAL: Alert and in no acute distress. CARDIOPULMONARY: No increased WOB. Speaking in clear sentences.  PSYCH: Pleasant and cooperative. Speech normal rate and rhythm. Affect is appropriate. Insight and judgement are appropriate. Attention is focused, linear, and appropriate.  NEURO: Oriented as arrived to appointment on time with no prompting.   Lab Results  Component Value Date   CREATININE 0.93 10/13/2017   BUN 12 10/13/2017   NA 137 10/13/2017   K 3.9 10/13/2017   CL 104 10/13/2017   CO2 26 10/13/2017   No results found for: "ALT", "AST", "GGT", "ALKPHOS", "BILITOT" No results found for: "HGBA1C" No results found for: "INSULIN" Lab Results  Component Value Date   TSH 0.682 04/21/2012   No results found for: "CHOL", "HDL", "LDLCALC", "LDLDIRECT", "TRIG", "CHOLHDL" No results found for: "VD25OH" Lab Results  Component Value Date   WBC 8.9 10/13/2017   HGB 14.0 10/13/2017   HCT 43.9 10/13/2017   MCV 91.3 10/13/2017   PLT 261 10/13/2017   No  results found for: "IRON", "TIBC", "FERRITIN"  Attestation Statements:   Reviewed by clinician on day of visit: allergies, medications, problem list, medical history, surgical history, family history, social history, and previous encounter notes.   Wilhemena Durie, am acting as transcriptionist for [employer], DO.   I have reviewed the above documentation for accuracy and completeness, and I agree with the above. Marjory Sneddon, D.O.  The [facility] was signed into law in 2016 which includes the topic of electronic health records.  This provides immediate access to information in MyChart.  This includes consultation notes, operative notes, office notes, lab results and pathology reports.  If you have any questions about what you read please let us know at your next visit so we can discuss your concerns and take corrective action if need be.  We are right here with you.

## 2022-03-12 ENCOUNTER — Ambulatory Visit (INDEPENDENT_AMBULATORY_CARE_PROVIDER_SITE_OTHER): Payer: Non-veteran care | Admitting: Internal Medicine

## 2022-03-19 ENCOUNTER — Telehealth (INDEPENDENT_AMBULATORY_CARE_PROVIDER_SITE_OTHER): Payer: Non-veteran care | Admitting: Family Medicine

## 2023-07-23 ENCOUNTER — Encounter: Payer: Self-pay | Admitting: Genetic Counselor

## 2023-07-24 ENCOUNTER — Inpatient Hospital Stay: Attending: Genetic Counselor | Admitting: Genetic Counselor

## 2023-07-24 ENCOUNTER — Other Ambulatory Visit: Payer: Self-pay | Admitting: Genetic Counselor

## 2023-07-24 ENCOUNTER — Encounter: Payer: Self-pay | Admitting: Genetic Counselor

## 2023-07-24 ENCOUNTER — Inpatient Hospital Stay

## 2023-07-24 DIAGNOSIS — Z803 Family history of malignant neoplasm of breast: Secondary | ICD-10-CM

## 2023-07-24 DIAGNOSIS — Z1379 Encounter for other screening for genetic and chromosomal anomalies: Secondary | ICD-10-CM

## 2023-07-24 LAB — GENETIC SCREENING ORDER

## 2023-07-24 NOTE — Progress Notes (Signed)
 REFERRING PROVIDER: Agustina Caroli, MD [address] [place],  [place] [address]  PRIMARY PROVIDER:  Cl[facility], [facility]  PRIMARY REASON FOR VISIT:  1. Family history of breast cancer      HISTORY OF PRESENT ILLNESS:   Aimee Mejia, a 53 y.o. female, was seen for a [place] cancer genetics consultation at the request of Dr. Willa Rough due to a family history of breast cancer.  Aimee Mejia presents to clinic today to discuss the possibility of a hereditary predisposition to cancer, genetic testing, and to further clarify her future cancer risks, as well as potential cancer risks for family members.   Aimee Mejia is a 53 y.o. female with no personal history of cancer.    CANCER HISTORY:  Oncology History   No history exists.     RISK FACTORS:  Menarche was at age 89.  First live birth at age 53.  OCP use for approximately 7 years.  Ovaries intact: yes.  Hysterectomy: yes.  Menopausal status: perimenopausal.  HRT use: 0 years. Colonoscopy: no; not examined. Mammogram within the last year: yes. Number of breast biopsies: 0. Up to date with pelvic exams: no. Any excessive radiation exposure in the past: no  Past Medical History:  Diagnosis Date   Chlamydia    resolved   Family history of breast cancer    GERD (gastroesophageal reflux disease)    Hypertension    Pre-diabetes    Sleep apnea    SOB (shortness of breath)    Yeast infection    resolved    Past Surgical History:  Procedure Laterality Date   ABDOMINAL HYSTERECTOMY     BUNIONECTOMY     CESAREAN SECTION  04/30/1996   x 1   CYSTOSCOPY Bilateral 09/14/2014   Procedure: CYSTOSCOPY;  Surgeon: Jaymes Graff, MD;  Location: [facility];  Service: Gynecology;  Laterality: Bilateral;   HEMORRHOID SURGERY     LAPAROSCOPIC ASSISTED VAGINAL HYSTERECTOMY N/A 09/14/2014   Procedure: LAPAROSCOPIC ASSISTED VAGINAL HYSTERECTOMY Bilateral Salpingectomy;  Surgeon: Jaymes Graff, MD;  Location: [facility];  Service: Gynecology;   Laterality: N/A;   WISDOM TOOTH EXTRACTION      Social History   Socioeconomic History   Marital status: Married    Spouse name: Not on file   Number of children: Not on file   Years of education: Not on file   Highest education level: Not on file  Occupational History   Not on file  Tobacco Use   Smoking status: Former    Current packs/day: 0.00    Average packs/day: 1 pack/day for 30.0 years (30.0 ttl pk-yrs)    Types: Cigarettes    Start date: 05/31/1984    Quit date: 05/31/2014    Years since quitting: 9.1   Smokeless tobacco: Never  Substance and Sexual Activity   Alcohol use: Yes    Comment: daily  -  beer    Drug use: No   Sexual activity: Not Currently    Partners: Male    Birth control/protection: Condom  Other Topics Concern   Not on file  Social History Narrative   Not on file   Social Drivers of Health   Financial Resource Strain: Not on file  Food Insecurity: Not on file  Transportation Needs: Not on file  Physical Activity: Not on file  Stress: Not on file  Social Connections: Not on file     FAMILY HISTORY:  We obtained a detailed, 4-generation family history.  Significant diagnoses are listed below: Family  History  Problem Relation Age of Onset   Breast cancer Mother 50       d. 48-50   Hypertension Maternal Grandmother    Diabetes Maternal Grandmother      The patient has one son who is cancer free.  She has a brother and sister who do not have cancer.  Both parents are deceased.  The patient does not have information about her father's side of the family.  The patient's mother was diagnosed with breast cancer in her early 69's and then again at 76.  Her siblings were reported to be cancer free and her parents are cancer free.  Aimee Mejia is unaware of previous family history of genetic testing for hereditary cancer risks.   GENETIC COUNSELING ASSESSMENT: Aimee Mejia is a 53 y.o. female with a family history of breast cancer which is  somewhat suggestive of a hereditary cancer syndrome and predisposition to cancer given her mother's young age of onset. We, therefore, discussed and recommended the following at today's visit.   DISCUSSION: We discussed that, in general, most cancer is not inherited in families, but instead is sporadic or familial. Sporadic cancers occur by chance and typically happen at older ages (>50 years) as this type of cancer is caused by genetic changes acquired during an individual's lifetime. Some families have more cancers than would be expected by chance; however, the ages or types of cancer are not consistent with a known genetic mutation or known genetic mutations have been ruled out. This type of familial cancer is thought to be due to a combination of multiple genetic, environmental, hormonal, and lifestyle factors. While this combination of factors likely increases the risk of cancer, the exact source of this risk is not currently identifiable or testable.  We discussed that 5 - 10% of breast cancer is hereditary, with most cases associated with BRCA mutations.  There are other genes that can be associated with hereditary breast cancer syndromes.  These include ATM, CHEK2 and PALB2.  We discussed that testing is beneficial for several reasons including knowing how to follow individuals after completing their treatment, identifying whether potential treatment options such as PARP inhibitors would be beneficial, and understand if other family members could be at risk for cancer and allow them to undergo genetic testing.   We reviewed the characteristics, features and inheritance patterns of hereditary cancer syndromes. We also discussed genetic testing, including the appropriate family members to test, the process of testing, insurance coverage and turn-around-time for results. We discussed the implications of a negative, positive, carrier and/or variant of uncertain significant result. Aimee Mejia  was offered a  common hereditary cancer panel (36+ genes) and an expanded pan-cancer panel (70+ genes). Aimee Mejia was informed of the benefits and limitations of each panel, including that expanded pan-cancer panels contain genes that do not have clear management guidelines at this point in time.  We also discussed that as the number of genes included on a panel increases, the chances of variants of uncertain significance increases. Aimee Mejia decided to pursue genetic testing for the CancerNext-Expanded+RNAinsight gene panel.   The CancerNext-Expanded gene panel offered by [facility] and includes sequencing, rearrangement, and RNA analysis for the following 76 genes: AIP, ALK, APC, ATM, AXIN2, BAP1, BARD1, BMPR1A, BRCA1, BRCA2, BRIP1, CDC73, CDH1, CDK4, CDKN1B, CDKN2A, CEBPA, CHEK2, CTNNA1, DDX41, DICER1, ETV6, FH, FLCN, GATA2, LZTR1, MAX, MBD4, MEN1, MET, MLH1, MSH2, MSH3, MSH6, MUTYH, NF1, NF2, NTHL1, PALB2, PHOX2B, PMS2, POT1, PRKAR1A, PTCH1, PTEN, RAD51C, RAD51D,  RB1, RET, RUNX1, SDHA, SDHAF2, SDHB, SDHC, SDHD, SMAD4, SMARCA4, SMARCB1, SMARCE1, STK11, SUFU, TMEM127, TP53, TSC1, TSC2, VHL, and WT1 (sequencing and deletion/duplication); EGFR, HOXB13, KIT, MITF, PDGFRA, POLD1, and POLE (sequencing only); EPCAM and GREM1 (deletion/duplication only).    Based on Aimee Mejia's family history of cancer, she meets medical criteria for genetic testing. Though Aimee Mejia is not personally affected, there are no affected family members that are willing/able/available to undergo hereditary cancer testing.  Therefore, Aimee Mejia the most informative family member available.  Despite that she meets criteria, she may still have an out of pocket cost. We discussed that if her out of pocket cost for testing is over $100, the laboratory will call and confirm whether she wants to proceed with testing.  If the out of pocket cost of testing is less than $100 she will be billed by the genetic testing laboratory.   We discussed that  some people do not want to undergo genetic testing due to fear of genetic discrimination.  The Genetic Information Nondiscrimination Act (GINA) was signed into federal law in 2008. GINA prohibits health insurers and most employers from discriminating against individuals based on genetic information (including the results of genetic tests and family history information). According to GINA, health insurance companies cannot consider genetic information to be a preexisting condition, nor can they use it to make decisions regarding coverage or rates. GINA also makes it illegal for most employers to use genetic information in making decisions about hiring, firing, promotion, or terms of employment. It is important to note that GINA does not offer protections for life insurance, disability insurance, or long-term care insurance. GINA does not apply to those in the [employer], those who work for companies with less than 15 employees, and new life insurance or long-term disability insurance policies.  Health status due to a cancer diagnosis is not protected under GINA. More information about GINA can be found by visiting [email or web address].  PLAN: After considering the risks, benefits, and limitations, Aimee Mejia provided informed consent to pursue genetic testing and the blood sample was sent to [facility] for analysis of the CancerNext-Expanded+RNAinsight. Results should be available within approximately 2-3 weeks' time, at which point they will be disclosed by telephone to Aimee Mejia, as will any additional recommendations warranted by these results. Aimee Mejia will receive a summary of her genetic counseling visit and a copy of her results once available. This information will also be available in Epic.   Lastly, we encouraged Aimee Mejia to remain in contact with cancer genetics annually so that we can continuously update the family history and inform her of any changes in cancer genetics and testing  that may be of benefit for this family.   Aimee Mejia questions were answered to her satisfaction today. Our contact information was provided should additional questions or concerns arise. Thank you for the referral and allowing [place] to share in the care of your patient.   Tearia Gibbs P. Lowell Guitar, MS, CGC Licensed, Patent attorney [email or web address] phone: [phone]  60 minutes were spent on the date of the encounter in service to the patient including preparation, face-to-face consultation, documentation and care coordination.  The patient was seen alone.  Drs. Meliton Rattan, and/or [place] were available for questions, if needed..    _______________________________________________________________________ For Office Staff:  Number of people involved in session: 1 Was an Intern/ student involved with case: no

## 2023-08-09 ENCOUNTER — Encounter: Payer: Self-pay | Admitting: Genetic Counselor

## 2023-08-09 ENCOUNTER — Telehealth: Payer: Self-pay | Admitting: Genetic Counselor

## 2023-08-09 ENCOUNTER — Ambulatory Visit: Payer: Self-pay | Admitting: Genetic Counselor

## 2023-08-09 DIAGNOSIS — Z1379 Encounter for other screening for genetic and chromosomal anomalies: Secondary | ICD-10-CM | POA: Insufficient documentation

## 2023-08-09 NOTE — Progress Notes (Signed)
 HPI:  Ms. Beever was previously seen in the [facility] due to a family history of breast cancer and concerns regarding a hereditary predisposition to cancer. Please refer to our prior cancer genetics clinic note for more information regarding our discussion, assessment and recommendations, at the time. Ms. Virrueta recent genetic test results were disclosed to her, as were recommendations warranted by these results. These results and recommendations are discussed in more detail below.  CANCER HISTORY:  Oncology History   No history exists.    FAMILY HISTORY:  We obtained a detailed, 4-generation family history.  Significant diagnoses are listed below: Family History  Problem Relation Age of Onset   Breast cancer Mother 29       d. 48-50   Hypertension Maternal Grandmother    Diabetes Maternal Grandmother        The patient has one son who is cancer free.  She has a brother and sister who do not have cancer.  Both parents are deceased.   The patient does not have information about her father's side of the family.   The patient's mother was diagnosed with breast cancer in her early 12's and then again at 33.  Her siblings were reported to be cancer free and her parents are cancer free.   Ms. Nazir is unaware of previous family history of genetic testing for hereditary cancer risks.  GENETIC TEST RESULTS: Genetic testing reported out on August 06, 2023 through the CancerNext-Expanded+RNAinsight cancer panel found no pathogenic mutations. The CancerNext-Expanded gene panel offered by [facility] and includes sequencing, rearrangement, and RNA analysis for the following 76 genes: AIP, ALK, APC, ATM, AXIN2, BAP1, BARD1, BMPR1A, BRCA1, BRCA2, BRIP1, CDC73, CDH1, CDK4, CDKN1B, CDKN2A, CEBPA, CHEK2, CTNNA1, DDX41, DICER1, ETV6, FH, FLCN, GATA2, LZTR1, MAX, MBD4, MEN1, MET, MLH1, MSH2, MSH3, MSH6, MUTYH, NF1, NF2, NTHL1, PALB2, PHOX2B, PMS2, POT1, PRKAR1A, PTCH1, PTEN,  RAD51C, RAD51D, RB1, RET, RUNX1, SDHA, SDHAF2, SDHB, SDHC, SDHD, SMAD4, SMARCA4, SMARCB1, SMARCE1, STK11, SUFU, TMEM127, TP53, TSC1, TSC2, VHL, and WT1 (sequencing and deletion/duplication); EGFR, HOXB13, KIT, MITF, PDGFRA, POLD1, and POLE (sequencing only); EPCAM and GREM1 (deletion/duplication only). The test report has been scanned into EPIC and is located under the Molecular Pathology section of the Results Review tab.  A portion of the result report is included below for reference.     We discussed with Ms. Aranas that because current genetic testing is not perfect, it is possible there may be a gene mutation in one of these genes that current testing cannot detect, but that chance is small.  We also discussed, that there could be another gene that has not yet been discovered, or that we have not yet tested, that is responsible for the cancer diagnoses in the family. It is also possible there is a hereditary cause for the cancer in the family that Ms. Bergren did not inherit and therefore was not identified in her testing.  Therefore, it is important to remain in touch with cancer genetics in the future so that we can continue to offer Ms. Rexrode the most up to date genetic testing.   ADDITIONAL GENETIC TESTING: We discussed with Ms. Hurlock that her genetic testing was fairly extensive.  If there are genes identified to increase cancer risk that can be analyzed in the future, we would be happy to discuss and coordinate this testing at that time.    CANCER SCREENING RECOMMENDATIONS: Ms. Suleiman test result is considered negative (normal).  This means that  we have not identified a hereditary cause for her family history of breast cancer at this time. Most cancers happen by chance and this negative test suggests that her family history of breast cancer may fall into this category.    Possible reasons for Ms. Boschert's negative genetic test include:  1. There may be a gene mutation in one of these genes  that current testing methods cannot detect but that chance is small.  2. There could be another gene that has not yet been discovered, or that we have not yet tested, that is responsible for the cancer diagnoses in the family.  3.  There may be no hereditary risk for cancer in the family. The cancers in Ms. Knoble and/or her family may be sporadic/familial or due to other genetic and environmental factors. 4. It is also possible there is a hereditary cause for the cancer in the family that Ms. Paynter did not inherit.  Therefore, it is recommended she continue to follow the cancer management and screening guidelines provided by her primary healthcare provider. An individual's cancer risk and medical management are not determined by genetic test results alone. Overall cancer risk assessment incorporates additional factors, including personal medical history, family history, and any available genetic information that may result in a personalized plan for cancer prevention and surveillance  The Tyrer-Cuzick model is one of multiple prediction models developed to estimate an individual's lifetime risk of developing breast cancer. The Tyrer-Cuzick model is endorsed by the [employer] (NCCN). This model includes many risk factors such as family history, endogenous estrogen exposure, and benign breast disease. The calculation is highly-dependent on the accuracy of clinical data provided by the patient and can change over time. The Tyrer-Cuzick model may be repeated to reflect new information in her personal or family history in the future.    Ms. Nevils has been determined to be at high risk for breast cancer. her Tyrer-Cuzick risk score is 20.2%.  For women with a greater than 20% lifetime risk of breast cancer, the [employer] (NCCN) recommends the following:   1.      Clinical encounter every 6-12 months to begin when identified as being at increased risk, but  not before age 75  2.      Annual mammograms. Tomosynthesis is recommended starting 10 years earlier than the youngest breast cancer diagnosis in the family or at age 70 (whichever comes first), but not before age 42    93.      Annual breast MRI starting 10 years earlier than the youngest breast cancer diagnosis in the family or at age 68 (whichever comes first), but not before age 70.      RECOMMENDATIONS FOR FAMILY MEMBERS:   Since she did not inherit a identifiable mutation in a cancer predisposition gene included on this panel, her children could not have inherited a known mutation from her in one of these genes. Individuals in this family might be at some increased risk of developing cancer, over the general population risk, simply due to the family history of cancer.  We recommended women in this family have a yearly mammogram beginning at age 47, or 32 years younger than the earliest onset of cancer, an annual clinical breast exam, and perform monthly breast self-exams. Women in this family should also have a gynecological exam as recommended by their primary provider. All family members should be referred for colonoscopy starting at age 31, or 5 years younger than the  earliest onset of cancer.  FOLLOW-UP: Lastly, we discussed with Ms. Avans that cancer genetics is a rapidly advancing field and it is possible that new genetic tests will be appropriate for her and/or her family members in the future. We encouraged her to remain in contact with cancer genetics on an annual basis so we can update her personal and family histories and let her know of advances in cancer genetics that may benefit this family.   Our contact number was provided. Ms. Rallo questions were answered to her satisfaction, and she knows she is welcome to call us at anytime with additional questions or concerns.   Maylon Cos, MS, [facility] Licensed, Certified Genetic Counselor [email or web address]

## 2023-08-09 NOTE — Telephone Encounter (Signed)
Revealed negative genetic testing.  Discussed that we do not know why there is cancer in the family. It could be due to a different gene that we are not testing, or maybe our current technology may not be able to pick something up.  It will be important for her to keep in contact with genetics to keep up with whether additional testing may be needed.
